# Patient Record
Sex: Male | Born: 1940 | Race: White | Hispanic: No | Smoking: Former smoker
Health system: Southern US, Community
[De-identification: ages and names within clinical notes are randomized; demographics above are authoritative.]

## PROBLEM LIST (undated history)

## (undated) DIAGNOSIS — I10 Essential (primary) hypertension: Secondary | ICD-10-CM

## (undated) DIAGNOSIS — C4490 Unspecified malignant neoplasm of skin, unspecified: Secondary | ICD-10-CM

## (undated) DIAGNOSIS — F329 Major depressive disorder, single episode, unspecified: Secondary | ICD-10-CM

## (undated) DIAGNOSIS — Z86010 Personal history of colonic polyps: Secondary | ICD-10-CM

## (undated) DIAGNOSIS — B159 Hepatitis A without hepatic coma: Secondary | ICD-10-CM

## (undated) DIAGNOSIS — K635 Polyp of colon: Secondary | ICD-10-CM

## (undated) DIAGNOSIS — N400 Enlarged prostate without lower urinary tract symptoms: Secondary | ICD-10-CM

## (undated) DIAGNOSIS — E782 Mixed hyperlipidemia: Secondary | ICD-10-CM

## (undated) DIAGNOSIS — K5790 Diverticulosis of intestine, part unspecified, without perforation or abscess without bleeding: Secondary | ICD-10-CM

## (undated) DIAGNOSIS — I2510 Atherosclerotic heart disease of native coronary artery without angina pectoris: Secondary | ICD-10-CM

## (undated) DIAGNOSIS — N200 Calculus of kidney: Secondary | ICD-10-CM

## (undated) HISTORY — PX: APPENDECTOMY: SHX54

## (undated) HISTORY — DX: Major depressive disorder, single episode, unspecified: F32.9

## (undated) HISTORY — PX: TONSILLECTOMY: SUR1361

## (undated) HISTORY — DX: Calculus of kidney: N20.0

## (undated) HISTORY — DX: Polyp of colon: K63.5

## (undated) HISTORY — DX: Benign prostatic hyperplasia without lower urinary tract symptoms: N40.0

## (undated) HISTORY — DX: Unspecified malignant neoplasm of skin, unspecified: C44.90

## (undated) HISTORY — DX: Personal history of colonic polyps: Z86.010

## (undated) HISTORY — DX: Essential (primary) hypertension: I10

## (undated) HISTORY — DX: Mixed hyperlipidemia: E78.2

## (undated) HISTORY — DX: Atherosclerotic heart disease of native coronary artery without angina pectoris: I25.10

## (undated) HISTORY — DX: Diverticulosis of intestine, part unspecified, without perforation or abscess without bleeding: K57.90

## (undated) HISTORY — DX: Hepatitis a without hepatic coma: B15.9

## (undated) HISTORY — PX: CYSTOSCOPY: SUR368

---

## 1961-04-17 HISTORY — PX: OTHER SURGICAL HISTORY: SHX169

## 1973-04-17 HISTORY — PX: OTHER SURGICAL HISTORY: SHX169

## 1990-04-17 DIAGNOSIS — B159 Hepatitis A without hepatic coma: Secondary | ICD-10-CM

## 1990-04-17 HISTORY — DX: Hepatitis a without hepatic coma: B15.9

## 1995-04-18 DIAGNOSIS — F32A Depression, unspecified: Secondary | ICD-10-CM

## 1995-04-18 HISTORY — DX: Depression, unspecified: F32.A

## 2002-04-15 ENCOUNTER — Ambulatory Visit (HOSPITAL_COMMUNITY): Admission: RE | Admit: 2002-04-15 | Discharge: 2002-04-15 | Payer: Self-pay | Admitting: Urology

## 2002-04-15 ENCOUNTER — Encounter: Payer: Self-pay | Admitting: Urology

## 2002-04-17 HISTORY — PX: CORONARY ARTERY BYPASS GRAFT: SHX141

## 2002-04-30 ENCOUNTER — Encounter: Payer: Self-pay | Admitting: Cardiology

## 2002-05-05 ENCOUNTER — Encounter: Payer: Self-pay | Admitting: Cardiology

## 2002-05-16 ENCOUNTER — Encounter: Payer: Self-pay | Admitting: Cardiology

## 2002-05-16 ENCOUNTER — Inpatient Hospital Stay (HOSPITAL_COMMUNITY): Admission: EM | Admit: 2002-05-16 | Discharge: 2002-05-23 | Payer: Self-pay | Admitting: Cardiology

## 2002-05-17 ENCOUNTER — Encounter: Payer: Self-pay | Admitting: Cardiology

## 2002-05-19 ENCOUNTER — Encounter: Payer: Self-pay | Admitting: Cardiology

## 2002-05-19 ENCOUNTER — Encounter: Payer: Self-pay | Admitting: Cardiothoracic Surgery

## 2002-05-20 ENCOUNTER — Encounter: Payer: Self-pay | Admitting: Cardiothoracic Surgery

## 2002-05-21 ENCOUNTER — Encounter: Payer: Self-pay | Admitting: Cardiology

## 2002-05-23 ENCOUNTER — Encounter: Payer: Self-pay | Admitting: Cardiology

## 2007-12-26 ENCOUNTER — Encounter: Payer: Self-pay | Admitting: Cardiology

## 2008-01-13 ENCOUNTER — Encounter: Payer: Self-pay | Admitting: Cardiology

## 2008-04-17 DIAGNOSIS — K635 Polyp of colon: Secondary | ICD-10-CM

## 2008-04-17 HISTORY — DX: Polyp of colon: K63.5

## 2008-10-14 ENCOUNTER — Encounter: Payer: Self-pay | Admitting: Cardiology

## 2009-03-17 HISTORY — PX: COLONOSCOPY: SHX174

## 2009-03-23 ENCOUNTER — Encounter: Payer: Self-pay | Admitting: Cardiology

## 2009-03-23 DIAGNOSIS — Z860100 Personal history of colon polyps, unspecified: Secondary | ICD-10-CM

## 2009-03-23 HISTORY — DX: Personal history of colon polyps, unspecified: Z86.0100

## 2009-03-23 HISTORY — DX: Personal history of colonic polyps: Z86.010

## 2009-10-14 ENCOUNTER — Encounter: Payer: Self-pay | Admitting: Cardiology

## 2009-11-15 ENCOUNTER — Encounter: Payer: Self-pay | Admitting: Cardiology

## 2010-06-08 ENCOUNTER — Telehealth (INDEPENDENT_AMBULATORY_CARE_PROVIDER_SITE_OTHER): Payer: Self-pay | Admitting: *Deleted

## 2010-06-09 ENCOUNTER — Encounter (INDEPENDENT_AMBULATORY_CARE_PROVIDER_SITE_OTHER): Payer: Self-pay | Admitting: *Deleted

## 2010-06-14 NOTE — Consult Note (Signed)
Summary: Consultation Report/ DR. OWEN  Consultation Report/ DR. OWEN   Imported By: Dorise Hiss 06/06/2010 15:21:12  _____________________________________________________________________  External Attachment:    Type:   Image     Comment:   External Document

## 2010-06-14 NOTE — Cardiovascular Report (Signed)
Summary: Cardiac Catheterization  Cardiac Catheterization   Imported By: Dorise Hiss 06/06/2010 15:15:30  _____________________________________________________________________  External Attachment:    Type:   Image     Comment:   External Document

## 2010-06-14 NOTE — Progress Notes (Signed)
Summary: Office Visit/ [facility]  Office Visit/ [facility]   Imported By: Dorise Hiss 06/06/2010 15:24:36  _____________________________________________________________________  External Attachment:    Type:   Image     Comment:   External Document

## 2010-06-14 NOTE — Op Note (Signed)
Summary: Operative Report/ DR. [email or web address]  Operative Report/ DR. Tyrone Sage   Imported By: Dorise Hiss 06/06/2010 15:19:11  _____________________________________________________________________  External Attachment:    Type:   Image     Comment:   External Document

## 2010-06-14 NOTE — Letter (Signed)
Summary: Discharge Summary/ DR. [email or web address]  Discharge Summary/ DR. Tyrone Sage   Imported By: Dorise Hiss 06/06/2010 15:22:57  _____________________________________________________________________  External Attachment:    Type:   Image     Comment:   External Document

## 2010-06-14 NOTE — Letter (Signed)
Summary: Pharmacist, community at [facility]. [address]   [place], [place] [address]   Phone: [phone]  Fax: [phone]      [facility] Cardiovascular Services  Cardiolite Stress Test     Warren Rojas  Your doctor has ordered a Cardiolite Stress Test to help determine the condition of your heart during stress. If you take blood pressure medicine, ask your doctor if you should take it the day of your test. You should not have anything to eat or drink at least 4 hours before your test is scheduled, and no caffeine (coffee, tea, decaf. or chocolate) for 24 hours before your test.   You will need to register at the Outpatient/Main Entrance at the hospital 30 minutes before your appointment time. It is a good idea to bring a copy of your order with you. They will direct you to the Diagnostic Imaging (Radiology) Department.  You will be asked to undress from the waist up and be given a hospital gown to wear, so dress comfortably from the waist down, for example:    Sweat pants, shorts or skirt   Rubber-soled lace up shoes (i.e. tennis shoes)  Plan on about three hours from registration to release from the hospital.    Date of Test:              Time of Test              Hold Atenolol 24 hours before test.

## 2010-06-14 NOTE — Progress Notes (Signed)
Summary: Pt Requested Call  Phone Note Call from Patient Call back at Home Phone [phone]   Summary of Call: Pt left message on voicemail asking for return call to discuss some problems as 3/2 appt has been moved to 3/30 d/t MD schedule change.   Left message to call back on voicemail. Initial call taken by: Cyril Loosen, RN, BSN,  June 08, 2010 4:10 PM     Appended Document: Pt Requested Call Pt states he's very active. He states sometimes when he's working on something he has an "ache" in his chest. Pt states he has no pain. He states sometimes he has a little tightness and SOB. He just wants to see Dr. Diona Browner to evaluate b/c he hasn't been seen in a while. Pt was scheduled for 3/30. He states he feels this appt date is fine. He will call back to the office if symptoms worsen. Pt has h/o CABG and states he needs 100,000 mile work up.   Appended Document: Pt Requested Call Does not appear that I have seen him since 2004. Since he is having some symptoms, would suggest that we go ahead and schedule an exercise Cardiolite, so I can review this with him at his office visit in March. We should try to see him sooner if his symptoms worsen in the interim however.  Appended Document: Pt Requested Call Left message to call back on voicemail.  Appended Document: Pt Requested Call Pt notified and verbalized understanding. Pt agreeable to test. Pt given verbal instructions for test over phone.   Clinical Lists Changes  Problems: Added new problem of CHEST DISCOMFORT (NFA-213.08) Medications: Added new medication of ATENOLOL 50 MG TABS (ATENOLOL) Take one tablet by mouth daily Added new medication of SIMVASTATIN 20 MG TABS (SIMVASTATIN) Take one tablet by mouth daily at bedtime Added new medication of FLOMAX 0.4 MG CAPS (TAMSULOSIN HCL) Take 1 capsule by mouth once a day Added new medication of FISH OIL 1200 MG CAPS (OMEGA-3 FATTY ACIDS) Take 1 capsule by mouth two times a  day Added new medication of COQ10 100 MG CAPS (COENZYME Q10) Take 1 capsule by mouth once a day Added new medication of MULTIVITAMINS  TABS (MULTIPLE VITAMIN) Take 1 tablet by mouth once a day Added new medication of COSAMIN DS 500-400 MG TABS (GLUCOSAMINE-CHONDROITIN) Take 3 tablets by mouth daily. Added new medication of ASPIRIN EC 325 MG TBEC (ASPIRIN) Take one tablet by mouth daily Orders: Added new Referral order of Nuclear Med (Nuc Med) - Signed

## 2010-06-14 NOTE — Progress Notes (Signed)
Summary: Office Visit/ [facility]  Office Visit/ [facility]   Imported By: Dorise Hiss 06/06/2010 15:25:46  _____________________________________________________________________  External Attachment:    Type:   Image     Comment:   External Document

## 2010-06-17 ENCOUNTER — Ambulatory Visit: Payer: Self-pay | Admitting: Cardiology

## 2010-07-05 DIAGNOSIS — R072 Precordial pain: Secondary | ICD-10-CM

## 2010-07-11 ENCOUNTER — Telehealth: Payer: Self-pay | Admitting: *Deleted

## 2010-07-11 ENCOUNTER — Encounter: Payer: Self-pay | Admitting: Cardiology

## 2010-07-11 NOTE — Telephone Encounter (Signed)
Left message to call back on voicemail regarding results.  

## 2010-07-12 ENCOUNTER — Encounter: Payer: Self-pay | Admitting: *Deleted

## 2010-07-12 ENCOUNTER — Telehealth: Payer: Self-pay | Admitting: *Deleted

## 2010-07-12 NOTE — Telephone Encounter (Signed)
Pt's son, Gerlene Burdock, notified of results.

## 2010-07-12 NOTE — Telephone Encounter (Signed)
error 

## 2010-07-14 ENCOUNTER — Encounter: Payer: Self-pay | Admitting: Cardiology

## 2010-07-14 DIAGNOSIS — I2510 Atherosclerotic heart disease of native coronary artery without angina pectoris: Secondary | ICD-10-CM | POA: Insufficient documentation

## 2010-07-14 DIAGNOSIS — E782 Mixed hyperlipidemia: Secondary | ICD-10-CM | POA: Insufficient documentation

## 2010-07-14 DIAGNOSIS — I10 Essential (primary) hypertension: Secondary | ICD-10-CM | POA: Insufficient documentation

## 2010-07-15 ENCOUNTER — Ambulatory Visit (INDEPENDENT_AMBULATORY_CARE_PROVIDER_SITE_OTHER): Payer: Medicare Other | Admitting: Cardiology

## 2010-07-15 ENCOUNTER — Encounter: Payer: Self-pay | Admitting: Cardiology

## 2010-07-15 VITALS — BP 130/57 | HR 60 | Ht 73.0 in | Wt 212.0 lb

## 2010-07-15 DIAGNOSIS — I10 Essential (primary) hypertension: Secondary | ICD-10-CM

## 2010-07-15 DIAGNOSIS — E782 Mixed hyperlipidemia: Secondary | ICD-10-CM

## 2010-07-15 DIAGNOSIS — I2510 Atherosclerotic heart disease of native coronary artery without angina pectoris: Secondary | ICD-10-CM

## 2010-07-15 DIAGNOSIS — I779 Disorder of arteries and arterioles, unspecified: Secondary | ICD-10-CM

## 2010-07-15 NOTE — Assessment & Plan Note (Signed)
Mild carotid atherosclerosis by carotid Dopplers at [facility] as noted above. Continue aspirin and efforts at lipid control.

## 2010-07-15 NOTE — Assessment & Plan Note (Signed)
Appears to have been well controlled over time, followed by Dr. Sherril Croon. Recent labs reviewed.

## 2010-07-15 NOTE — Assessment & Plan Note (Signed)
Blood pressure reasonable today. Continue present regimen.

## 2010-07-15 NOTE — Patient Instructions (Signed)
Your physician you to follow up in 1 year. You will receive a reminder letter in the mail one-two months in advance. If you don't receive a letter, please call our office to schedule the follow-up appointment. Your physician recommends that you continue on your current medications as directed. Please refer to the Current Medication list given to you today. 

## 2010-07-15 NOTE — Assessment & Plan Note (Signed)
Symptomatically stable at this point, known multivessel disease status post CABG in 2004. Recent Cardiolite reviewed, some evidence of anteroapical scar with low normal LVEF, however no active ischemia. We discussed warning signs, and continuing medical therapy. Annual visit will be scheduled, sooner if needed.

## 2010-07-15 NOTE — Progress Notes (Signed)
Clinical Summary Mr. Geiman is a 70 y.o.male presenting to the office for followup, having not been seen since 2004. He states that he has been following with Dr. Sherril Croon regularly. In terms of symptoms, he denies any clear-cut angina. He does report some left shoulder pain radiating into his chest after doing some vigorous activity in the yard. This has improved. We did refer him for a Cardiolite study after phone call to the office.  Recent followup exercise Cardiolite from 20 March showed no diagnostic ST segment changes, LVEF 49%, medium fixed defect in the mid to apical anterior distribution associated with hypokinesis as well as inferior defect associated with normal wall motion. Potentially indicative of anterior apical scar but no frank ischemia. We discussed this today.  Lab work from June 2011 showed BUN 18, creatinine 0.9, AST 24, ALT 20, cholesterol 145, triglycerides 105, HDL 38, LDL 86, sodium 138, potassium 5.0, TSH 3.7, hemoglobin 15.7, platelets 234.  Records indicate an exercise Cardiolite done through [facility] back in September 2009 demonstrating abnormal ST segment changes, however normal perfusion, LVEF 65%. Carotid Dopplers from that time showed 0-39% bilateral ICA stenoses.   Allergies  Allergen Reactions  . Codeine   . Innovar (Fentanyl-Droperidol)     Current outpatient prescriptions:aspirin 325 MG tablet, Take 325 mg by mouth daily.  , Disp: , Rfl: ;  atenolol (TENORMIN) 50 MG tablet, Take 50 mg by mouth daily.  , Disp: , Rfl: ;  Coenzyme Q10 (CO Q 10) 10 MG CAPS, Take one by mouth daily  , Disp: , Rfl: ;  Glucosamine-Chondroitin (COSAMIN DS) 500-400 MG CAPS, Take 3 by mouth daily  , Disp: , Rfl: ;  Multiple Vitamin (MULTIVITAMIN) tablet, Take 1 tablet by mouth daily.  , Disp: , Rfl:  Omega-3 Fatty Acids (FISH OIL) 1200 MG CAPS, Take one by mouth twice daily  , Disp: , Rfl: ;  simvastatin (ZOCOR) 20 MG tablet, Take 20 mg by mouth at bedtime. , Disp: , Rfl: ;   Tamsulosin HCl (FLOMAX) 0.4 MG CAPS, Take one by mouth daily  , Disp: , Rfl:    Past Medical History  Diagnosis Date  . Essential hypertension, benign   . Mixed hyperlipidemia   . Coronary atherosclerosis of native coronary artery     Multivessel, normal LVEF  . Bladder outlet obstruction     Past Surgical History  Procedure Date  . Resection of urethral diverticulum 1975  . Deviated nasal septum repair [id number]  . Tonsillectomy   . Appendectomy   . Cystoscopy   . Coronary artery bypass graft 2004    Dr. Tyrone Sage - LIMA to LAD, RIMA to ramus, left radial to OM, SVG to diagonal, SVG to PDA    Family History  Problem Relation Age of Onset  . Coronary artery disease Brother     Premature cardiovascular disease    Social History Mr. Voorhees reports that he has quit smoking. His smoking use included Cigarettes. He has never used smokeless tobacco. Mr. Wisehart reports that he drinks alcohol.  Review of Systems No fevers, chills, or unusual weight change. No progressive shortness of breath, cough, hemoptysis, or wheezing. No palpitations, dizziness, or syncope. No dysphasia or odynophagia. Stable appetite with no abdominal pain, melena, or hematochezia. No orthopnea, PND, or lower extremity edema. No focal motor weakness, memory problems, or speech deficits. Otherwise systems reviewed and negative except as already outlined.   Physical Examination Filed Vitals:   07/15/10 0954  BP: 130/57  Pulse:  60  Overweight male in no acute distress. HEENT: Conjunctiva and lids normal, oropharynx with moist mucosa. Neck: Supple, no elevated JVP or carotid bruits, no thyromegaly. Lungs: Clear to auscultation, nonlabored. Thorax: Stable, well-healed sternal incision. Cardiac: Regular rate and rhythm, no S3 gallop or rub. Abdomen: Soft, nontender, bowel sounds present. Skin: Warm and dry. Extremities: No pitting edema, distal pulses 1-2+. Musculoskeletal: No gross  deformities. Neuropsychiatric: Alert and oriented x3, affect appropriate.   ECG Sinus bradycardia at 58 beats per minute with nonspecific anterolateral T wave changes.   Problem List and Plan

## 2011-07-06 ENCOUNTER — Encounter: Payer: Self-pay | Admitting: Cardiology

## 2011-07-06 ENCOUNTER — Ambulatory Visit (INDEPENDENT_AMBULATORY_CARE_PROVIDER_SITE_OTHER): Payer: Medicare Other | Admitting: Cardiology

## 2011-07-06 VITALS — BP 151/70 | HR 58 | Ht 73.0 in | Wt 214.0 lb

## 2011-07-06 DIAGNOSIS — I2510 Atherosclerotic heart disease of native coronary artery without angina pectoris: Secondary | ICD-10-CM

## 2011-07-06 DIAGNOSIS — E782 Mixed hyperlipidemia: Secondary | ICD-10-CM

## 2011-07-06 DIAGNOSIS — I779 Disorder of arteries and arterioles, unspecified: Secondary | ICD-10-CM

## 2011-07-06 DIAGNOSIS — I10 Essential (primary) hypertension: Secondary | ICD-10-CM

## 2011-07-06 NOTE — Progress Notes (Signed)
   Clinical Summary Mr. Greenleaf is a 71 y.o.male presenting for followup. He was seen in March of last year. He reports that he feels "great." No active angina or shortness of breath with exertion. He states he is walking 2 miles most days of the week depending on the weather. Also reports compliance with his medications, no significant bleeding or reflux symptoms.  He continues to follow Dr. Sherril Croon, has a full physical scheduled for July with lab work.  I reviewed his testing from last year. Followup ECG is reviewed below.   Allergies  Allergen Reactions  . Codeine   . Innovar (Fentanyl-Droperidol)     Current Outpatient Prescriptions  Medication Sig Dispense Refill  . aspirin 325 MG tablet Take 325 mg by mouth daily.        Marland Kitchen atenolol (TENORMIN) 50 MG tablet Take 50 mg by mouth daily.        . Coenzyme Q10 (COQ-10) 100 MG CAPS Take 1 capsule by mouth daily.      . Glucosamine-Chondroitin (COSAMIN DS) 500-400 MG CAPS Take 3 by mouth daily        . Multiple Vitamin (MULTIVITAMIN) tablet Take 1 tablet by mouth daily.        . Omega-3 Fatty Acids (FISH OIL) 1200 MG CAPS Take 1 capsule by mouth 2 (two) times daily.       . simvastatin (ZOCOR) 20 MG tablet Take 20 mg by mouth at bedtime.       . Tamsulosin HCl (FLOMAX) 0.4 MG CAPS Take one by mouth daily          Past Medical History  Diagnosis Date  . Essential hypertension, benign   . Mixed hyperlipidemia   . Coronary atherosclerosis of native coronary artery     Multivessel, normal LVEF  . Bladder outlet obstruction     Past Surgical History  Procedure Date  . Resection of urethral diverticulum 1975  . Deviated nasal septum repair [id number]  . Tonsillectomy   . Appendectomy   . Cystoscopy   . Coronary artery bypass graft 2004    Dr. Tyrone Sage - LIMA to LAD, RIMA to ramus, left radial to OM, SVG to diagonal, SVG to PDA    Family History  Problem Relation Age of Onset  . Coronary artery disease Brother     Premature  cardiovascular disease    Social History Mr. Reggio reports that he has quit smoking. His smoking use included Cigarettes. He has never used smokeless tobacco. Mr. Montano reports that he drinks alcohol.  Review of Systems No palpitations. No bleeding problems. No peripheral edema. No dizziness or syncope. Stable appetite. Otherwise negative.  Physical Examination Filed Vitals:   07/06/11 1056  BP: 151/70  Pulse: 58   Normally nourished appearing male in no acute distress.  HEENT: Conjunctiva and lids normal, oropharynx with moist mucosa.  Neck: Supple, no elevated JVP or carotid bruits, no thyromegaly.  Lungs: Clear to auscultation, nonlabored.  Thorax: Stable, well-healed sternal incision.  Cardiac: Regular rate and rhythm, no S3 gallop or rub.  Abdomen: Soft, nontender, bowel sounds present.  Skin: Warm and dry.  Extremities: No pitting edema, distal pulses 1-2+.  Musculoskeletal: No gross deformities.  Neuropsychiatric: Alert and oriented x3, affect appropriate.   ECG Sinus bradycardia, normal.   Problem List and Plan

## 2011-07-06 NOTE — Assessment & Plan Note (Signed)
Mild carotid atherosclerosis by carotid Dopplers at [facility]. Continue aspirin and efforts at lipid control.

## 2011-07-06 NOTE — Assessment & Plan Note (Signed)
He continues on simvastatin. Lipids have been followed by Dr. Sherril Croon. Goal LDL under 100.

## 2011-07-06 NOTE — Patient Instructions (Signed)
Your physician you to follow up in 1 year. You will receive a reminder letter in the mail one-two months in advance. If you don't receive a letter, please call our office to schedule the follow-up appointment. Your physician recommends that you continue on your current medications as directed. Please refer to the Current Medication list given to you today. 

## 2011-07-06 NOTE — Assessment & Plan Note (Signed)
Blood pressure is up some today. Continue diet and exercise, follow up with Dr. Sherril Croon. No changes made to present medical regimen.

## 2011-07-06 NOTE — Assessment & Plan Note (Signed)
Symptomatically stable on medical therapy. Followup ECG is normal. He reports compliance with his medications, also regular exercise. Continue observation and annual followup.

## 2013-10-06 ENCOUNTER — Emergency Department (HOSPITAL_COMMUNITY): Payer: Medicare Other

## 2013-10-06 ENCOUNTER — Encounter (HOSPITAL_COMMUNITY): Payer: Self-pay | Admitting: Emergency Medicine

## 2013-10-06 DIAGNOSIS — I2510 Atherosclerotic heart disease of native coronary artery without angina pectoris: Secondary | ICD-10-CM | POA: Insufficient documentation

## 2013-10-06 DIAGNOSIS — N139 Obstructive and reflux uropathy, unspecified: Secondary | ICD-10-CM | POA: Insufficient documentation

## 2013-10-06 DIAGNOSIS — Z87891 Personal history of nicotine dependence: Secondary | ICD-10-CM | POA: Insufficient documentation

## 2013-10-06 DIAGNOSIS — Z885 Allergy status to narcotic agent status: Secondary | ICD-10-CM | POA: Insufficient documentation

## 2013-10-06 DIAGNOSIS — E782 Mixed hyperlipidemia: Secondary | ICD-10-CM | POA: Insufficient documentation

## 2013-10-06 DIAGNOSIS — R1013 Epigastric pain: Secondary | ICD-10-CM | POA: Insufficient documentation

## 2013-10-06 DIAGNOSIS — Z79899 Other long term (current) drug therapy: Secondary | ICD-10-CM | POA: Insufficient documentation

## 2013-10-06 DIAGNOSIS — I10 Essential (primary) hypertension: Secondary | ICD-10-CM | POA: Insufficient documentation

## 2013-10-06 DIAGNOSIS — Z888 Allergy status to other drugs, medicaments and biological substances status: Secondary | ICD-10-CM | POA: Insufficient documentation

## 2013-10-06 DIAGNOSIS — R112 Nausea with vomiting, unspecified: Secondary | ICD-10-CM | POA: Insufficient documentation

## 2013-10-06 DIAGNOSIS — Z881 Allergy status to other antibiotic agents status: Secondary | ICD-10-CM | POA: Insufficient documentation

## 2013-10-06 LAB — CBC WITH DIFFERENTIAL/PLATELET
BASOS PCT: 0 % (ref 0–1)
Basophils Absolute: 0 10*3/uL (ref 0.0–0.1)
EOS ABS: 0.1 10*3/uL (ref 0.0–0.7)
EOS PCT: 1 % (ref 0–5)
HEMATOCRIT: 43.7 % (ref 39.0–52.0)
Hemoglobin: 15.4 g/dL (ref 13.0–17.0)
Lymphocytes Relative: 12 % (ref 12–46)
Lymphs Abs: 2 10*3/uL (ref 0.7–4.0)
MCH: 32.8 pg (ref 26.0–34.0)
MCHC: 35.2 g/dL (ref 30.0–36.0)
MCV: 93.2 fL (ref 78.0–100.0)
MONO ABS: 0.6 10*3/uL (ref 0.1–1.0)
Monocytes Relative: 4 % (ref 3–12)
NEUTROS ABS: 14.8 10*3/uL — AB (ref 1.7–7.7)
Neutrophils Relative %: 83 % — ABNORMAL HIGH (ref 43–77)
Platelets: 229 10*3/uL (ref 150–400)
RBC: 4.69 MIL/uL (ref 4.22–5.81)
RDW: 11.6 % (ref 11.5–15.5)
WBC: 17.6 10*3/uL — ABNORMAL HIGH (ref 4.0–10.5)

## 2013-10-06 LAB — COMPREHENSIVE METABOLIC PANEL
ALBUMIN: 4.3 g/dL (ref 3.5–5.2)
ALT: 117 U/L — ABNORMAL HIGH (ref 0–53)
AST: 209 U/L — ABNORMAL HIGH (ref 0–37)
Alkaline Phosphatase: 110 U/L (ref 39–117)
BILIRUBIN TOTAL: 0.8 mg/dL (ref 0.3–1.2)
BUN: 27 mg/dL — AB (ref 6–23)
CALCIUM: 9.4 mg/dL (ref 8.4–10.5)
CHLORIDE: 96 meq/L (ref 96–112)
CO2: 27 mEq/L (ref 19–32)
Creatinine, Ser: 0.89 mg/dL (ref 0.50–1.35)
GFR calc Af Amer: >90 mL/min (ref >90)
GFR calc non Af Amer: 84 mL/min — ABNORMAL LOW (ref >90)
Glucose, Bld: 199 mg/dL — ABNORMAL HIGH (ref 70–99)
Potassium: 4 mEq/L (ref 3.7–5.3)
Sodium: 139 mEq/L (ref 137–147)
Total Protein: 7.6 g/dL (ref 6.0–8.3)

## 2013-10-06 LAB — TROPONIN I

## 2013-10-06 MED ORDER — ONDANSETRON 4 MG PO TBDP
4.0000 mg | ORAL_TABLET | Freq: Once | ORAL | Status: AC
  Administered 2013-10-06: 4 mg via ORAL
  Filled 2013-10-06: qty 1

## 2013-10-06 NOTE — ED Notes (Signed)
Patient transported to X-ray 

## 2013-10-06 NOTE — ED Notes (Signed)
Pt states pain that is lower chest and radiates to his back. Pt states that his pain was after he ate. Pt states that he has been nauseated and he vomited once in triage. Pt states deep pain that catches his breath.

## 2013-10-07 ENCOUNTER — Emergency Department (HOSPITAL_COMMUNITY): Payer: Medicare Other

## 2013-10-07 ENCOUNTER — Emergency Department (HOSPITAL_COMMUNITY)
Admission: EM | Admit: 2013-10-07 | Discharge: 2013-10-07 | Disposition: A | Discharge status: Home / Self Care | Payer: Medicare Other | Attending: Emergency Medicine | Admitting: Emergency Medicine

## 2013-10-07 DIAGNOSIS — R112 Nausea with vomiting, unspecified: Secondary | ICD-10-CM

## 2013-10-07 DIAGNOSIS — R1013 Epigastric pain: Secondary | ICD-10-CM

## 2013-10-07 LAB — URINALYSIS, ROUTINE W REFLEX MICROSCOPIC
BILIRUBIN URINE: NEGATIVE
Glucose, UA: NEGATIVE mg/dL
Hgb urine dipstick: NEGATIVE
KETONES UR: NEGATIVE mg/dL
Leukocytes, UA: NEGATIVE
NITRITE: NEGATIVE
Protein, ur: NEGATIVE mg/dL
Specific Gravity, Urine: 1.028 (ref 1.005–1.030)
Urobilinogen, UA: 1 mg/dL (ref 0.0–1.0)
pH: 7 (ref 5.0–8.0)

## 2013-10-07 LAB — LIPASE, BLOOD: Lipase: 1287 U/L — ABNORMAL HIGH (ref 11–59)

## 2013-10-07 MED ORDER — IOHEXOL 300 MG/ML  SOLN
80.0000 mL | Freq: Once | INTRAMUSCULAR | Status: AC | PRN
  Administered 2013-10-07: 80 mL via INTRAVENOUS

## 2013-10-07 NOTE — ED Notes (Signed)
CT notified that pt is finished with contrast.  

## 2013-10-07 NOTE — ED Provider Notes (Signed)
CSN: [id number]     Arrival date & time 10/06/13  2135 History   First MD Initiated Contact with Patient 10/07/13 0259     Chief Complaint  Patient presents with  . Chest Pain  . Emesis     Patient is a 73 y.o. male presenting with abdominal pain. The history is provided by the patient.  Abdominal Pain Pain location:  Epigastric Pain quality: sharp   Pain radiation: back. Pain severity:  Moderate Onset quality:  Sudden Progression:  Resolved Chronicity:  New Context: eating   Relieved by:  Nothing Worsened by:  Nothing tried Associated symptoms: nausea and vomiting   Associated symptoms: no chest pain and no fever   PT reports that soon after eating, he had onset of epigastric pain that radiated to his back.  He reports it lasted up to 30 minutes He denies CP He reports he had nausea/vomiting  He is now pain free.  He has never had this pain before.   Past Medical History  Diagnosis Date  . Essential hypertension, benign   . Mixed hyperlipidemia   . Coronary atherosclerosis of native coronary artery     Multivessel, normal LVEF  . Bladder outlet obstruction    Past Surgical History  Procedure Laterality Date  . Resection of urethral diverticulum  1975  . Deviated nasal septum repair  [id number]  . Tonsillectomy    . Appendectomy    . Cystoscopy    . Coronary artery bypass graft  2004    Dr. Servando Snare - LIMA to LAD, RIMA to ramus, left radial to OM, SVG to diagonal, SVG to PDA   Family History  Problem Relation Age of Onset  . Coronary artery disease Brother     Premature cardiovascular disease   History  Substance Use Topics  . Smoking status: Former Smoker    Types: Cigarettes  . Smokeless tobacco: Never Used     Comment: smoked 3 packs total while in [employer]  . Alcohol Use: Yes     Comment: Occasional    Review of Systems  Constitutional: Negative for fever.  Cardiovascular: Negative for chest pain.  Gastrointestinal: Positive for nausea, vomiting and  abdominal pain.  All other systems reviewed and are negative.     Allergies  Innovar; Cephalosporins; and Codeine  Home Medications   Prior to Admission medications   Medication Sig Start Date End Date Taking? Authorizing Provider  atenolol (TENORMIN) 50 MG tablet Take 50 mg by mouth daily.     Yes Historical Provider, MD  AVODART 0.5 MG capsule Take 0.5 mg by mouth daily.  09/14/13  Yes Historical Provider, MD  Coenzyme Q10 (COQ-10) 100 MG CAPS Take 1 capsule by mouth daily.   Yes Historical Provider, MD  Multiple Vitamin (MULTIVITAMIN) tablet Take 1 tablet by mouth daily.     Yes Historical Provider, MD  simvastatin (ZOCOR) 20 MG tablet Take 20 mg by mouth at bedtime.    Yes Historical Provider, MD  Tamsulosin HCl (FLOMAX) 0.4 MG CAPS Take 0.4 mg by mouth 2 (two) times daily. Take one by mouth daily   Yes Historical Provider, MD   BP 144/68  Pulse 81  Temp(Src) 98 F (36.7 C) (Oral)  Resp 16  Ht 6\' 1"  (1.854 m)  Wt 208 lb (94.348 kg)  BMI 27.45 kg/m2  SpO2 98% Physical Exam CONSTITUTIONAL: Well developed/well nourished HEAD: Normocephalic/atraumatic EYES: EOMI/PERRL, no icterus ENMT: Mucous membranes moist NECK: supple no meningeal signs SPINE:entire spine nontender CV: S1/S2 noted,  no murmurs/rubs/gallops noted LUNGS: Lungs are clear to auscultation bilaterally, no apparent distress ABDOMEN: soft, mild epigastric tenderness, no rebound or guarding GU:no cva tenderness NEURO: Pt is awake/alert, moves all extremitiesx4 EXTREMITIES: pulses normal, full ROM SKIN: warm, color normal PSYCH: no abnormalities of mood noted  ED Course  Procedures   Pt monitored in the ED for several hours Extensive workup initiated due to h/o significant abd pain prior to arrival with abnormal CBC/LFTs/lipase.  Concern for pancreatitis, however CT imaging negative for any acute process.  He again denied any CP and reports pain was in abdomen after eating.  He is clinically well appearing.  I  have very low suspicion for occult ACS given his history/exam.  He would like to go home and f/u as outpatient.  Labs Review Labs Reviewed  CBC WITH DIFFERENTIAL - Abnormal; Notable for the following:    WBC 17.6 (*)    Neutrophils Relative % 83 (*)    Neutro Abs 14.8 (*)    All other components within normal limits  COMPREHENSIVE METABOLIC PANEL - Abnormal; Notable for the following:    Glucose, Bld 199 (*)    BUN 27 (*)    AST 209 (*)    ALT 117 (*)    GFR calc non Af Amer 84 (*)    All other components within normal limits  LIPASE, BLOOD - Abnormal; Notable for the following:    Lipase 1287 (*)    All other components within normal limits  TROPONIN I  URINALYSIS, ROUTINE W REFLEX MICROSCOPIC    Imaging Review Dg Chest 2 View  10/06/2013   CLINICAL DATA:  Epigastric pain.  Nausea and vomiting.  EXAM: CHEST  2 VIEW  COMPARISON:  10/24/2010  FINDINGS: Prior CABG. Heart size within normal limits. The lungs appear clear. No pleural effusion.  IMPRESSION: 1. No acute findings.  Prior CABG.   Electronically Signed   By: Sherryl Barters M.D.   On: 10/06/2013 22:05   US Abdomen Complete  10/07/2013   CLINICAL DATA:  Abdominal pain.  EXAM: ULTRASOUND ABDOMEN COMPLETE  COMPARISON:  None.  FINDINGS: Gallbladder:  No gallstones or wall thickening visualized. No sonographic Murphy sign noted.  Common bile duct:  Diameter: 4.2 mm, normal  Liver:  Visualize liver parenchymal echotexture is homogeneous without focal lesions identified. Portions are obscured by rib shadows.  IVC:  No abnormality visualized.  Pancreas:  Not well visualized.  Spleen:  Size and appearance within normal limits.  Right Kidney:  Length: 12.6 cm. 5 mm echogenic focus in the midpole likely representing a small cyst. No evidence of hydronephrosis.  Left Kidney:  Length: 11.7 cm. Echogenicity within normal limits. No mass or hydronephrosis visualized.  Abdominal aorta:  No aneurysm visualized.  Other findings:  None.   IMPRESSION: Gallbladder is unremarkable. Probable nonobstructing stone in the right kidney.   Electronically Signed   By: Lucienne Capers M.D.   On: 10/07/2013 04:44   Ct Abdomen Pelvis W Contrast  10/07/2013   CLINICAL DATA:  Severe epigastric pain, nausea and vomiting.  EXAM: CT ABDOMEN AND PELVIS WITH CONTRAST  TECHNIQUE: Multidetector CT imaging of the abdomen and pelvis was performed using the standard protocol following bolus administration of intravenous contrast.  CONTRAST:  70mL OMNIPAQUE IOHEXOL 300 MG/ML  SOLN  COMPARISON:  None.  FINDINGS: Atelectasis in the lung bases. Postoperative change in the mediastinum. Coronary artery calcification.  The liver, spleen, gallbladder caught pancreas, adrenal glands, kidneys, abdominal aorta, inferior vena cava, and  retroperitoneal lymph nodes are unremarkable the stomach, small bowel, and colon are not abnormally distended. No suggestion of bowel wall thickening. Small duodenum diverticulum. No free air or free fluid in the abdomen.  Pelvis: Prostate gland is enlarged, measuring 6.2 x 5.6 cm. Bladder wall is not thickened. No pelvic mass or lymphadenopathy. We appendix is not identified. No evidence of diverticulitis. Scattered diverticula are present in the sigmoid region. No free or loculated pelvic fluid collections. No significant pelvic lymphadenopathy. Degenerative changes in the spine. No destructive bone lesions are appreciated.  IMPRESSION: No acute process demonstrated in the abdomen or pelvis that would explain patient's symptoms. Enlarged prostate gland.   Electronically Signed   By: Lucienne Capers M.D.   On: 10/07/2013 06:36     EKG Interpretation   Date/Time:  Monday October 06 2013 21:36:37 EDT Ventricular Rate:  52 PR Interval:  168 QRS Duration: 98 QT Interval:  486 QTC Calculation: 451 R Axis:   82 Text Interpretation:  Sinus bradycardia with Premature supraventricular  complexes Otherwise normal ECG artifact noted Confirmed by  Christy Gentles  MD,  Elenore Rota ([id number]) on 10/07/2013 3:00:15 AM      MDM   Final diagnoses:  Epigastric pain  Non-intractable vomiting with nausea, vomiting of unspecified type    Nursing notes including past medical history and social history reviewed and considered in documentation Labs/vital reviewed and considered     Sharyon Cable, MD 10/07/13 0730

## 2013-10-07 NOTE — ED Notes (Signed)
Patient transported to Ultrasound 

## 2013-10-07 NOTE — ED Notes (Signed)
Patient transported to CT 

## 2013-10-07 NOTE — Discharge Instructions (Signed)

## 2013-10-16 ENCOUNTER — Encounter: Payer: Self-pay | Admitting: Internal Medicine

## 2013-11-26 ENCOUNTER — Encounter: Payer: Self-pay | Admitting: Gastroenterology

## 2013-12-15 ENCOUNTER — Encounter: Payer: Self-pay | Admitting: Internal Medicine

## 2013-12-15 ENCOUNTER — Ambulatory Visit (INDEPENDENT_AMBULATORY_CARE_PROVIDER_SITE_OTHER): Payer: Medicare Other | Admitting: Internal Medicine

## 2013-12-15 VITALS — BP 144/80 | HR 56 | Ht 71.0 in | Wt 206.5 lb

## 2013-12-15 DIAGNOSIS — R748 Abnormal levels of other serum enzymes: Secondary | ICD-10-CM | POA: Insufficient documentation

## 2013-12-15 DIAGNOSIS — R7402 Elevation of levels of lactic acid dehydrogenase (LDH): Secondary | ICD-10-CM

## 2013-12-15 DIAGNOSIS — R740 Nonspecific elevation of levels of transaminase and lactic acid dehydrogenase [LDH]: Secondary | ICD-10-CM

## 2013-12-15 DIAGNOSIS — K859 Acute pancreatitis without necrosis or infection, unspecified: Secondary | ICD-10-CM

## 2013-12-15 DIAGNOSIS — Z86010 Personal history of colonic polyps: Secondary | ICD-10-CM

## 2013-12-15 NOTE — Patient Instructions (Signed)
We will contact you after Dr. Owens Loffler reviews your chart about doing an EUS.   Today you have been given a handout on pancreas disease.   I appreciate the opportunity to care for you.

## 2013-12-15 NOTE — Assessment & Plan Note (Signed)
Presumably from a stone in the bile duct. These are resolved. Liver normal on CT and ultrasound.

## 2013-12-15 NOTE — Progress Notes (Signed)
Subjective:    Patient ID: Warren Rojas, male    DOB: February 08, 1941, 73 y.o.   MRN: [id number]  HPI The patient is a very pleasant divorced elderly white man with a history of acute epigastric pain radiating to the back associated with nausea vomiting on June 22. He was evaluated in the common ER with ultrasound and CT scanning which were unrevealing. No gallstones. His transaminases were elevated his lipase was over 2000. He was sick for about 2436 hours was not admitted. He has been well one mild episode similar since. He has seen his internist and his LFTs and lipase are back to normal. He has a son who is a Midwife in [place], and a suggestive maybe he needs a HIDA scan with ejection fraction. He does not have heartburn or dysphagia and is generally well. Dr. Woody Seller think she ought to be considered for an upper endoscopy. GI review of systems is otherwise negative at this time.  Wt Readings from Last 3 Encounters:  12/15/13 206 lb 8 oz (93.668 kg)  10/06/13 208 lb (94.348 kg)  07/06/11 214 lb (97.07 kg)    Allergies  Allergen Reactions  . Innovar [Fentanyl-Droperidol] Anaphylaxis    Aspiration  . Cephalosporins Other (See Comments)    "Had Hepatitis from water and was advised to stay away of these medications"  . Codeine Nausea And Vomiting   Outpatient Prescriptions Prior to Visit  Medication Sig Dispense Refill  . atenolol (TENORMIN) 50 MG tablet Take 50 mg by mouth daily.        . AVODART 0.5 MG capsule Take 0.5 mg by mouth daily.       . Coenzyme Q10 (COQ-10) 100 MG CAPS Take 1 capsule by mouth daily.      . Multiple Vitamin (MULTIVITAMIN) tablet Take 1 tablet by mouth daily.        . simvastatin (ZOCOR) 20 MG tablet Take 20 mg by mouth at bedtime.       . Tamsulosin HCl (FLOMAX) 0.4 MG CAPS Take 0.4 mg by mouth 2 (two) times daily. Take one by mouth daily       No facility-administered medications prior to visit.   Past Medical History  Diagnosis Date  . Essential  hypertension, benign   . Mixed hyperlipidemia   . Coronary atherosclerosis of native coronary artery     Multivessel, normal LVEF  . Bladder outlet obstruction   . Kidney stone   . BPH (benign prostatic hypertrophy)   . Diverticulosis   . Hepatitis A 1/92  . Depression 1997  . Skin cancer   . Colon polyp 2010   Past Surgical History  Procedure Laterality Date  . Resection of urethral diverticulum  1975  . Deviated nasal septum repair  [id number]  . Tonsillectomy    . Appendectomy    . Cystoscopy    . Coronary artery bypass graft  2004    Dr. Servando Snare - LIMA to LAD, RIMA to ramus, left radial to OM, SVG to diagonal, SVG to PDA  . Colonoscopy  03/2009    tortuous colon   History   Social History  . Marital Status: Married    Spouse Name: N/A    Number of Children: 3  . Years of Education: N/A   Occupational History  . Retired     [employer] most recently   Social History Main Topics  . Smoking status: Former Smoker    Types: Cigarettes    Quit date:  04/18/1963  . Smokeless tobacco: Never Used     Comment: smoked 3 packs total while in [employer]  . Alcohol Use: No  . 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 Coronary artery disease Brother     Premature cardiovascular disease  . Heart disease Mother   . Hypertension Mother   . Hypertension Father   . Transient ischemic attack Father   . Anuerysm Father     aortic   . Pancreatic cancer Sister     Review of Systems Positive for back pain after exercise, he urinates excessively he is on Avodart for benign prosthetic hypertrophy. All other systems are negative except in the history of present illness    Objective:   Physical Exam General:  Well-developed, well-nourished and in no acute  distress Eyes:  anicteric. ENT:   Mouth and posterior pharynx free of lesions.  Neck:   supple w/o thyromegaly or mass.  Lungs: Clear to auscultation bilaterally. Heart:  S1S2, no rubs, murmurs, gallops. Abdomen:  soft, non-tender, no hepatosplenomegaly, hernia, or mass and BS+.  Lymph:  no cervical or supraclavicular adenopathy. Extremities:   no edema Skin   no rash. Neuro:  A&O x 3.  Psych:  appropriate mood and  Affect.   Data Reviewed: Primary care notes emergency room visits, images on CT and ultrasound.    Assessment & Plan:  Acute pancreatitis My sense is this was probably biliary in origin. Not sure however. We talked about an empiric cholecystectomy. He is not in favor of that and I understand. HIDA scan could be helpful but I think an endoscopic ultrasound with imaging of the bile duct, gallbladder and pancreas are appropriate given this clinical scenario and his age. I reviewed the risks of that somewhat. He understands the chart will be reviewed by Dr. Ardis Hughs, our endosonographer and a determination will be made and we will contact him.  Abnormal transaminases-transient in the setting of epigastric pain Presumably from a stone in the bile duct. These are resolved. Liver normal on CT and ultrasound.  Personal history of rectal adenoma He should have a repeat colonoscopy in December of this year or thereabouts. We'll place a recall.   I appreciate the opportunity to care for this patient. CC: VYAS,DHRUV B., MD

## 2013-12-15 NOTE — Assessment & Plan Note (Signed)
My sense is this was probably biliary in origin. Not sure however. We talked about an empiric cholecystectomy. He is not in favor of that and I understand. HIDA scan could be helpful but I think an endoscopic ultrasound with imaging of the bile duct, gallbladder and pancreas are appropriate given this clinical scenario and his age. I reviewed the risks of that somewhat. He understands the chart will be reviewed by Dr. Ardis Hughs, our endosonographer and a determination will be made and we will contact him.

## 2013-12-15 NOTE — Assessment & Plan Note (Signed)
He should have a repeat colonoscopy in December of this year or thereabouts. We'll place a recall.

## 2013-12-17 ENCOUNTER — Other Ambulatory Visit: Payer: Self-pay

## 2013-12-17 ENCOUNTER — Telehealth: Payer: Self-pay

## 2013-12-17 ENCOUNTER — Encounter (HOSPITAL_COMMUNITY): Payer: Self-pay | Admitting: Pharmacy Technician

## 2013-12-17 ENCOUNTER — Encounter (HOSPITAL_COMMUNITY): Payer: Self-pay | Admitting: *Deleted

## 2013-12-17 DIAGNOSIS — K8050 Calculus of bile duct without cholangitis or cholecystitis without obstruction: Secondary | ICD-10-CM

## 2013-12-17 DIAGNOSIS — R7989 Other specified abnormal findings of blood chemistry: Secondary | ICD-10-CM

## 2013-12-17 DIAGNOSIS — R109 Unspecified abdominal pain: Secondary | ICD-10-CM

## 2013-12-17 DIAGNOSIS — R945 Abnormal results of liver function studies: Secondary | ICD-10-CM

## 2013-12-17 NOTE — Telephone Encounter (Signed)
Warren Rojas, He needs upper EUS, radial only, +/- ERCP at same time. Next available EUS Thursday. This is for abd pain, elevated liver tests, ? Bile duct stones. Thanks ----- Message ----- From: Gatha Mayer, MD Sent: 12/16/2013 10:12 AM To: Milus Banister, MD Sure OK to set it up Thanks ----- Message ----- From: Milus Banister, MD Sent: 12/15/2013 12:39 PM To: Gatha Mayer, MD Glendell Docker. I think EUS would be helpful. Would you want me to proceed with ERCP at same time if I find any CBD stones? ----- Message ----- From: Gatha Mayer, MD Sent: 12/15/2013 12:11 PM To: Milus Banister, MD Dan, please review his records, I think an endoscopic ultrasound makes sense for him to me know your thoughts.

## 2013-12-17 NOTE — Telephone Encounter (Signed)
EUS scheduled, pt instructed and medications reviewed.  Patient instructions mailed to home.  Patient to call with any questions or concerns.  

## 2013-12-17 NOTE — Telephone Encounter (Signed)
Left message on machine to call back  

## 2013-12-25 ENCOUNTER — Encounter (HOSPITAL_COMMUNITY)
Admission: RE | Disposition: A | Discharge status: Home / Self Care | Payer: Self-pay | Source: Ambulatory Visit | Attending: Gastroenterology

## 2013-12-25 ENCOUNTER — Encounter (HOSPITAL_COMMUNITY): Payer: Self-pay | Admitting: Gastroenterology

## 2013-12-25 ENCOUNTER — Ambulatory Visit (HOSPITAL_COMMUNITY)
Admission: RE | Admit: 2013-12-25 | Discharge: 2013-12-25 | Disposition: A | Discharge status: Home / Self Care | Payer: Medicare Other | Source: Ambulatory Visit | Attending: Gastroenterology | Admitting: Gastroenterology

## 2013-12-25 ENCOUNTER — Encounter (HOSPITAL_COMMUNITY): Payer: Medicare Other | Admitting: Anesthesiology

## 2013-12-25 ENCOUNTER — Ambulatory Visit (HOSPITAL_COMMUNITY): Payer: Medicare Other | Admitting: Anesthesiology

## 2013-12-25 DIAGNOSIS — Z87891 Personal history of nicotine dependence: Secondary | ICD-10-CM | POA: Diagnosis not present

## 2013-12-25 DIAGNOSIS — Z85828 Personal history of other malignant neoplasm of skin: Secondary | ICD-10-CM | POA: Insufficient documentation

## 2013-12-25 DIAGNOSIS — Z951 Presence of aortocoronary bypass graft: Secondary | ICD-10-CM | POA: Insufficient documentation

## 2013-12-25 DIAGNOSIS — F329 Major depressive disorder, single episode, unspecified: Secondary | ICD-10-CM | POA: Diagnosis not present

## 2013-12-25 DIAGNOSIS — I2510 Atherosclerotic heart disease of native coronary artery without angina pectoris: Secondary | ICD-10-CM | POA: Insufficient documentation

## 2013-12-25 DIAGNOSIS — Z85048 Personal history of other malignant neoplasm of rectum, rectosigmoid junction, and anus: Secondary | ICD-10-CM | POA: Diagnosis not present

## 2013-12-25 DIAGNOSIS — I739 Peripheral vascular disease, unspecified: Secondary | ICD-10-CM | POA: Insufficient documentation

## 2013-12-25 DIAGNOSIS — F3289 Other specified depressive episodes: Secondary | ICD-10-CM | POA: Insufficient documentation

## 2013-12-25 DIAGNOSIS — E782 Mixed hyperlipidemia: Secondary | ICD-10-CM | POA: Insufficient documentation

## 2013-12-25 DIAGNOSIS — K8050 Calculus of bile duct without cholangitis or cholecystitis without obstruction: Secondary | ICD-10-CM

## 2013-12-25 DIAGNOSIS — B159 Hepatitis A without hepatic coma: Secondary | ICD-10-CM | POA: Diagnosis not present

## 2013-12-25 DIAGNOSIS — N320 Bladder-neck obstruction: Secondary | ICD-10-CM | POA: Diagnosis not present

## 2013-12-25 DIAGNOSIS — K859 Acute pancreatitis without necrosis or infection, unspecified: Secondary | ICD-10-CM | POA: Diagnosis present

## 2013-12-25 DIAGNOSIS — K5730 Diverticulosis of large intestine without perforation or abscess without bleeding: Secondary | ICD-10-CM | POA: Diagnosis not present

## 2013-12-25 DIAGNOSIS — I10 Essential (primary) hypertension: Secondary | ICD-10-CM | POA: Diagnosis not present

## 2013-12-25 DIAGNOSIS — N138 Other obstructive and reflux uropathy: Secondary | ICD-10-CM | POA: Insufficient documentation

## 2013-12-25 DIAGNOSIS — N401 Enlarged prostate with lower urinary tract symptoms: Secondary | ICD-10-CM | POA: Insufficient documentation

## 2013-12-25 HISTORY — PX: EUS: SHX5427

## 2013-12-25 SURGERY — UPPER ENDOSCOPIC ULTRASOUND (EUS) LINEAR
Anesthesia: General

## 2013-12-25 MED ORDER — PROPOFOL 10 MG/ML IV BOLUS
INTRAVENOUS | Status: AC
  Filled 2013-12-25: qty 20

## 2013-12-25 MED ORDER — SODIUM CHLORIDE 0.9 % IV SOLN: INTRAVENOUS | Status: DC

## 2013-12-25 MED ORDER — LACTATED RINGERS IV SOLN
INTRAVENOUS | Status: DC
  Administered 2013-12-25: 13:00:00 via INTRAVENOUS

## 2013-12-25 MED ORDER — SUCCINYLCHOLINE CHLORIDE 20 MG/ML IJ SOLN
INTRAMUSCULAR | Status: DC | PRN
  Administered 2013-12-25: 100 mg via INTRAVENOUS

## 2013-12-25 MED ORDER — PROPOFOL 10 MG/ML IV BOLUS
INTRAVENOUS | Status: DC | PRN
  Administered 2013-12-25: 180 mg via INTRAVENOUS

## 2013-12-25 MED ORDER — ONDANSETRON HCL 4 MG/2ML IJ SOLN
INTRAMUSCULAR | Status: DC | PRN
  Administered 2013-12-25: 4 mg via INTRAVENOUS

## 2013-12-25 MED ORDER — ONDANSETRON HCL 4 MG/2ML IJ SOLN
INTRAMUSCULAR | Status: AC
  Filled 2013-12-25: qty 2

## 2013-12-25 MED ORDER — FENTANYL CITRATE 0.05 MG/ML IJ SOLN
INTRAMUSCULAR | Status: DC | PRN
  Administered 2013-12-25: 100 ug via INTRAVENOUS

## 2013-12-25 MED ORDER — HYDROMORPHONE HCL PF 2 MG/ML IJ SOLN
INTRAMUSCULAR | Status: AC
  Filled 2013-12-25: qty 1

## 2013-12-25 MED ORDER — METOPROLOL TARTRATE 1 MG/ML IV SOLN
INTRAVENOUS | Status: DC | PRN
  Administered 2013-12-25: 2 mg via INTRAVENOUS

## 2013-12-25 MED ORDER — FENTANYL CITRATE 0.05 MG/ML IJ SOLN
INTRAMUSCULAR | Status: AC
  Filled 2013-12-25: qty 2

## 2013-12-25 NOTE — Interval H&P Note (Signed)
History and Physical Interval Note:  12/25/2013 1:57 PM  Warren Rojas  has presented today for surgery, with the diagnosis of Abdominal pain, acute [789.00, 338.19] Elevated liver function tests [790.6] Gall stones, common bile duct [574.50]  The various methods of treatment have been discussed with the patient and family. After consideration of risks, benefits and other options for treatment, the patient has consented to  Procedure(s): UPPER ENDOSCOPIC ULTRASOUND (EUS) LINEAR (N/A) ENDOSCOPIC RETROGRADE CHOLANGIOPANCREATOGRAPHY (ERCP) (N/A) as a surgical intervention .  The patient's history has been reviewed, patient examined, no change in status, stable for surgery.  I have reviewed the patient's chart and labs.  Questions were answered to the patient's satisfaction.     Milus Banister

## 2013-12-25 NOTE — Op Note (Signed)
[facility] [place], [address]   ENDOSCOPIC ULTRASOUND PROCEDURE REPORT  PATIENT: Warren Rojas, Warren Rojas  MR#: [id number] BIRTHDATE: May 21, 1940  GENDER: Male ENDOSCOPIST: Milus Banister, MD REFERRED BY:  Gatha Mayer, M.D, [facility] PROCEDURE DATE:  12/25/2013 PROCEDURE:   Upper EUS ASA CLASS:      Class II INDICATIONS:   acute pancreatitis 09/2013 with elevated liver tests, normal [place], normal CT scan. MEDICATIONS: GA, administered by CRNA  DESCRIPTION OF PROCEDURE:   After the risks benefits and alternatives of the procedure were  explained, informed consent was obtained. The patient was then placed in the left, lateral, decubitus postion and IV sedation was administered. Throughout the procedure, the patients blood pressure, pulse and oxygen saturations were monitored continuously.  Under direct visualization, the Pentax EUS Radial [id number]  endoscope was introduced through the mouth  and advanced to the second portion of the duodenum .  Water was used as necessary to provide an acoustic interface.  Upon completion of the imaging, water was removed and the patient was sent to the recovery room in satisfactory condition.    Endoscopic findings (limited views with radial echoendoscope): 1. Normal UGI tract  EUS findings: 1. CBD was normal appearing: 4.63mm diameter, contained no stones or sludge 2. Main pancreatic duct was normal; non-dilated 3. Small perimpullary duodenal diverticulum (hyperechoic air artifact) 4. Pancreatic parenchyma was normal in head, neck, body and tail 5. Limited views of gallbladder, liver, spleen, portal and splenic vessels were all normal.  Impression: No clear etiology for his 09/2013 mild acute pancreatitis.  Would follow clinically and if he has repeat unexplained pancreatitis, should consider gallbadder resection for potential "microlithiasis."   _______________________________ eSigned:  Milus Banister, MD  12/25/2013 3:05 PM

## 2013-12-25 NOTE — Discharge Instructions (Signed)
YOU HAD AN ENDOSCOPIC PROCEDURE TODAY: Refer to the procedure report that was given to you for any specific questions about what was found during the examination.  If the procedure report does not answer your questions, please call your gastroenterologist to clarify. ° °YOU SHOULD EXPECT: Some feelings of bloating in the abdomen. Passage of more gas than usual.  Walking can help get rid of the air that was put into your GI tract during the procedure and reduce the bloating. If you had a lower endoscopy (such as a colonoscopy or flexible sigmoidoscopy) you may notice spotting of blood in your stool or on the toilet paper.  ° °DIET: Your first meal following the procedure should be a light meal and then it is ok to progress to your normal diet.  A half-sandwich or bowl of soup is an example of a good first meal.  Heavy or fried foods are harder to digest and may make you feel nasueas or bloated.  Drink plenty of fluids but you should avoid alcoholic beverages for 24 hours. ° °ACTIVITY: Your care partner should take you home directly after the procedure.  You should plan to take it easy, moving slowly for the rest of the day.  You can resume normal activity the day after the procedure however you should NOT DRIVE or use heavy machinery for 24 hours (because of the sedation medicines used during the test).   ° °SYMPTOMS TO REPORT IMMEDIATELY  °A gastroenterologist can be reached at any hour.  Please call your doctor's office for any of the following symptoms: ° °· Following lower endoscopy (colonoscopy, flexible sigmoidoscopy) ° Excessive amounts of blood in the stool ° Significant tenderness, worsening of abdominal pains ° Swelling of the abdomen that is new, acute ° Fever of 100° or higher °· Following upper endoscopy (EGD, EUS, ERCP) ° Vomiting of blood or coffee ground material ° New, significant abdominal pain ° New, significant chest pain or pain under the shoulder blades ° Painful or persistently difficult  swallowing ° New shortness of breath ° Black, tarry-looking stools ° °FOLLOW UP: °If any biopsies were taken you will be contacted by phone or by letter within the next 1-3 weeks.  Call your gastroenterologist if you have not heard about the biopsies in 3 weeks.  °Please also call your gastroenterologist's office with any specific questions about appointments or follow up tests. ° ° °General Anesthesia, Care After °Refer to this sheet in the next few weeks. These instructions provide you with information on caring for yourself after your procedure. Your health care provider may also give you more specific instructions. Your treatment has been planned according to current medical practices, but problems sometimes occur. Call your health care provider if you have any problems or questions after your procedure. °WHAT TO EXPECT AFTER THE PROCEDURE °After the procedure, it is typical to experience: °· Sleepiness. °· Nausea and vomiting. °HOME CARE INSTRUCTIONS °· For the first 24 hours after general anesthesia: °¨ Have a responsible person with you. °¨ Do not drive a car. If you are alone, do not take public transportation. °¨ Do not drink alcohol. °¨ Do not take medicine that has not been prescribed by your health care provider. °¨ Do not sign important papers or make important decisions. °¨ You may resume a normal diet and activities as directed by your health care provider. °· Change bandages (dressings) as directed. °· If you have questions or problems that seem related to general anesthesia, call the hospital and   ask for the anesthetist or anesthesiologist on call. °SEEK MEDICAL CARE IF: °· You have nausea and vomiting that continue the day after anesthesia. °· You develop a rash. °SEEK IMMEDIATE MEDICAL CARE IF:  °· You have difficulty breathing. °· You have chest pain. °· You have any allergic problems. °Document Released: 07/10/2000 Document Revised: 04/08/2013 Document Reviewed: 10/17/2012 °ExitCare® Patient  Information ©2015 [employer]. This information is not intended to replace advice given to you by your health care provider. Make sure you discuss any questions you have with your health care provider. ° °

## 2013-12-25 NOTE — Anesthesia Postprocedure Evaluation (Signed)
  Anesthesia Post-op Note  Patient: Warren Rojas  Procedure(s) Performed: Procedure(s) (LRB): UPPER ENDOSCOPIC ULTRASOUND (EUS) LINEAR (N/A)  Patient Location: PACU  Anesthesia Type: General  Level of Consciousness: awake and alert   Airway and Oxygen Therapy: Patient Spontanous Breathing  Post-op Pain: mild  Post-op Assessment: Post-op Vital signs reviewed, Patient's Cardiovascular Status Stable, Respiratory Function Stable, Patent Airway and No signs of Nausea or vomiting  Last Vitals:  Filed Vitals:   12/25/13 1511  BP: 167/72  Pulse:   Resp:     Post-op Vital Signs: stable   Complications: No apparent anesthesia complications

## 2013-12-25 NOTE — Transfer of Care (Signed)
Immediate Anesthesia Transfer of Care Note  Patient: Warren Rojas  Procedure(s) Performed: Procedure(s): UPPER ENDOSCOPIC ULTRASOUND (EUS) LINEAR (N/A)  Patient Location: PACU and Endoscopy Unit  Anesthesia Type:General  Level of Consciousness: awake, alert , oriented and patient cooperative  Airway & Oxygen Therapy: Patient Spontanous Breathing and Patient connected to face mask oxygen  Post-op Assessment: Report given to PACU RN, Post -op Vital signs reviewed and stable and Patient moving all extremities  Post vital signs: Reviewed and stable  Complications: No apparent anesthesia complications

## 2013-12-25 NOTE — H&P (View-Only) (Signed)
Subjective:    Patient ID: Warren Rojas, male    DOB: 1940-07-04, 73 y.o.   MRN: [id number]  HPI The patient is a very pleasant divorced elderly white man with a history of acute epigastric pain radiating to the back associated with nausea vomiting on June 22. He was evaluated in the common ER with ultrasound and CT scanning which were unrevealing. No gallstones. His transaminases were elevated his lipase was over 2000. He was sick for about 2436 hours was not admitted. He has been well one mild episode similar since. He has seen his internist and his LFTs and lipase are back to normal. He has a son who is a Midwife in [place], and a suggestive maybe he needs a HIDA scan with ejection fraction. He does not have heartburn or dysphagia and is generally well. Dr. Woody Seller think she ought to be considered for an upper endoscopy. GI review of systems is otherwise negative at this time.  Wt Readings from Last 3 Encounters:  12/15/13 206 lb 8 oz (93.668 kg)  10/06/13 208 lb (94.348 kg)  07/06/11 214 lb (97.07 kg)    Allergies  Allergen Reactions  . Innovar [Fentanyl-Droperidol] Anaphylaxis    Aspiration  . Cephalosporins Other (See Comments)    "Had Hepatitis from water and was advised to stay away of these medications"  . Codeine Nausea And Vomiting   Outpatient Prescriptions Prior to Visit  Medication Sig Dispense Refill  . atenolol (TENORMIN) 50 MG tablet Take 50 mg by mouth daily.        . AVODART 0.5 MG capsule Take 0.5 mg by mouth daily.       . Coenzyme Q10 (COQ-10) 100 MG CAPS Take 1 capsule by mouth daily.      . Multiple Vitamin (MULTIVITAMIN) tablet Take 1 tablet by mouth daily.        . simvastatin (ZOCOR) 20 MG tablet Take 20 mg by mouth at bedtime.       . Tamsulosin HCl (FLOMAX) 0.4 MG CAPS Take 0.4 mg by mouth 2 (two) times daily. Take one by mouth daily       No facility-administered medications prior to visit.   Past Medical History  Diagnosis Date  . Essential  hypertension, benign   . Mixed hyperlipidemia   . Coronary atherosclerosis of native coronary artery     Multivessel, normal LVEF  . Bladder outlet obstruction   . Kidney stone   . BPH (benign prostatic hypertrophy)   . Diverticulosis   . Hepatitis A 1/92  . Depression 1997  . Skin cancer   . Colon polyp 2010   Past Surgical History  Procedure Laterality Date  . Resection of urethral diverticulum  1975  . Deviated nasal septum repair  [id number]  . Tonsillectomy    . Appendectomy    . Cystoscopy    . Coronary artery bypass graft  2004    Dr. Servando Snare - LIMA to LAD, RIMA to ramus, left radial to OM, SVG to diagonal, SVG to PDA  . Colonoscopy  03/2009    tortuous colon   History   Social History  . Marital Status: Married    Spouse Name: N/A    Number of Children: 3  . Years of Education: N/A   Occupational History  . Retired     [employer] most recently   Social History Main Topics  . Smoking status: Former Smoker    Types: Cigarettes    Quit date:  04/18/1963  . Smokeless tobacco: Never Used     Comment: smoked 3 packs total while in [employer]  . Alcohol Use: No  . 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 Coronary artery disease Brother     Premature cardiovascular disease  . Heart disease Mother   . Hypertension Mother   . Hypertension Father   . Transient ischemic attack Father   . Anuerysm Father     aortic   . Pancreatic cancer Sister     Review of Systems Positive for back pain after exercise, he urinates excessively he is on Avodart for benign prosthetic hypertrophy. All other systems are negative except in the history of present illness    Objective:   Physical Exam General:  Well-developed, well-nourished and in no acute  distress Eyes:  anicteric. ENT:   Mouth and posterior pharynx free of lesions.  Neck:   supple w/o thyromegaly or mass.  Lungs: Clear to auscultation bilaterally. Heart:  S1S2, no rubs, murmurs, gallops. Abdomen:  soft, non-tender, no hepatosplenomegaly, hernia, or mass and BS+.  Lymph:  no cervical or supraclavicular adenopathy. Extremities:   no edema Skin   no rash. Neuro:  A&O x 3.  Psych:  appropriate mood and  Affect.   Data Reviewed: Primary care notes emergency room visits, images on CT and ultrasound.    Assessment & Plan:  Acute pancreatitis My sense is this was probably biliary in origin. Not sure however. We talked about an empiric cholecystectomy. He is not in favor of that and I understand. HIDA scan could be helpful but I think an endoscopic ultrasound with imaging of the bile duct, gallbladder and pancreas are appropriate given this clinical scenario and his age. I reviewed the risks of that somewhat. He understands the chart will be reviewed by Dr. Ardis Hughs, our endosonographer and a determination will be made and we will contact him.  Abnormal transaminases-transient in the setting of epigastric pain Presumably from a stone in the bile duct. These are resolved. Liver normal on CT and ultrasound.  Personal history of rectal adenoma He should have a repeat colonoscopy in December of this year or thereabouts. We'll place a recall.   I appreciate the opportunity to care for this patient. CC: VYAS,DHRUV B., MD

## 2013-12-25 NOTE — Anesthesia Preprocedure Evaluation (Addendum)
Anesthesia Evaluation  Patient identified by MRN, date of birth, ID band Patient awake    Reviewed: Allergy & Precautions, H&P , NPO status , Patient's Chart, lab work & pertinent test results, reviewed documented beta blocker date and time   Airway Mallampati: II TM Distance: >3 FB Neck ROM: Full    Dental no notable dental hx.    Pulmonary neg pulmonary ROS, former smoker,  breath sounds clear to auscultation  Pulmonary exam normal       Cardiovascular Exercise Tolerance: Good hypertension, Pt. on home beta blockers and Pt. on medications + CAD, + CABG and + Peripheral Vascular Disease negative cardio ROS  Rhythm:Regular Rate:Normal  Reports that he walks 2 miles regularly without any SOB or chest pain    Neuro/Psych PSYCHIATRIC DISORDERS Depression negative neurological ROS     GI/Hepatic negative GI ROS, Neg liver ROS, (+) Hepatitis -, A  Endo/Other  negative endocrine ROS  Renal/GU Renal diseasenegative Renal ROSnephrolithisis  negative genitourinary   Musculoskeletal negative musculoskeletal ROS (+)   Abdominal   Peds negative pediatric ROS (+)  Hematology negative hematology ROS (+)   Anesthesia Other Findings   Reproductive/Obstetrics negative OB ROS                          Anesthesia Physical Anesthesia Plan  ASA: III  Anesthesia Plan: General   Post-op Pain Management:    Induction: Intravenous  Airway Management Planned: Oral ETT  Additional Equipment:   Intra-op Plan:   Post-operative Plan: Extubation in OR  Informed Consent: I have reviewed the patients History and Physical, chart, labs and discussed the procedure including the risks, benefits and alternatives for the proposed anesthesia with the patient or authorized representative who has indicated his/her understanding and acceptance.   Dental advisory given  Plan Discussed with: CRNA  Anesthesia Plan  Comments:        Anesthesia Quick Evaluation

## 2013-12-26 ENCOUNTER — Encounter (HOSPITAL_COMMUNITY): Payer: Self-pay | Admitting: Gastroenterology

## 2014-03-30 ENCOUNTER — Encounter: Payer: Self-pay | Admitting: Internal Medicine

## 2014-08-26 ENCOUNTER — Encounter: Payer: Self-pay | Admitting: Internal Medicine

## 2015-10-10 IMAGING — CT CT ABD-PELV W/ CM
2 of 5 series · 16 of 46 positions shown, 18 images · IV contrast (omnipaque)
Comparison: None.

CLINICAL DATA: Severe epigastric pain, nausea and vomiting.

EXAM:
CT ABDOMEN AND PELVIS WITH CONTRAST
TECHNIQUE: Multidetector CT imaging of the abdomen and pelvis was performed
using the standard protocol following bolus administration of
intravenous contrast.
CONTRAST:  80mL OMNIPAQUE IOHEXOL 300 MG/ML  SOLN

[Series 2: abd/ pelvis 5.0 i30f 1 · axial · 0.78mm/px · z∈[+783,+1243]mm · 13 of 104 slices shown, 15 images]
[im 6/104  soft-tissue]
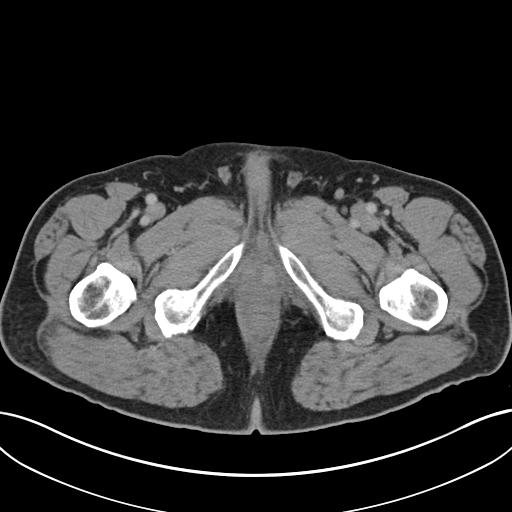
[im 6/104  bone]
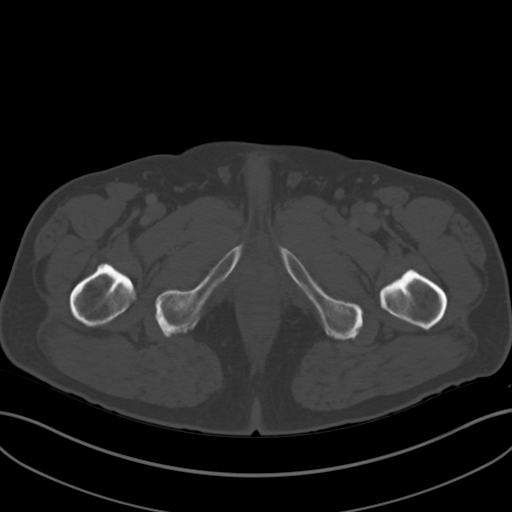
[im 16/104  soft-tissue]
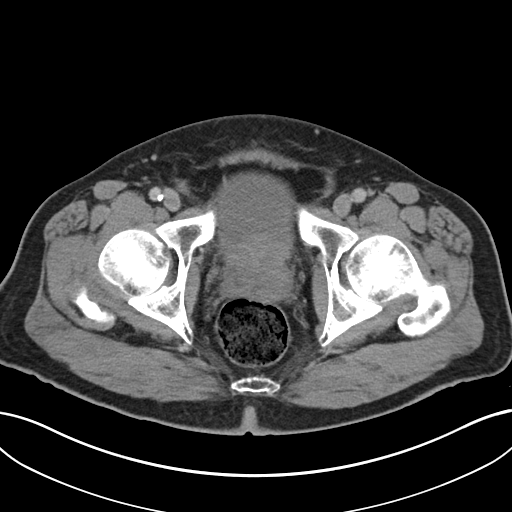
[im 21/104  soft-tissue]
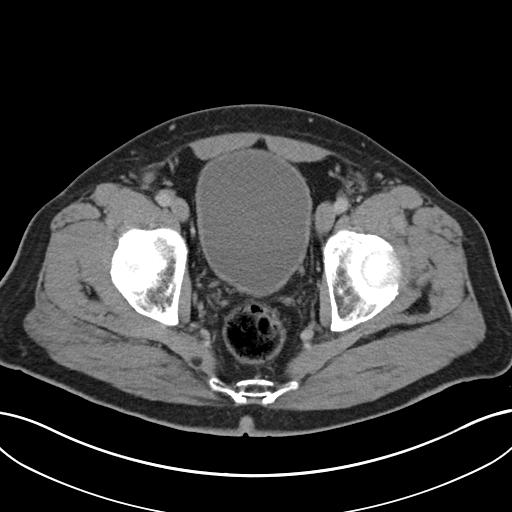
[im 31/104  soft-tissue]
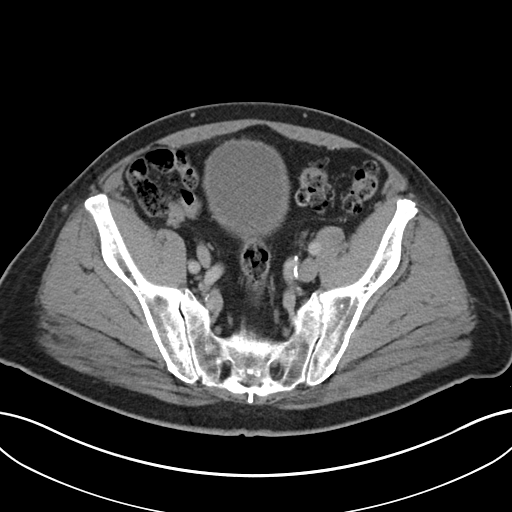
[im 37/104  soft-tissue]
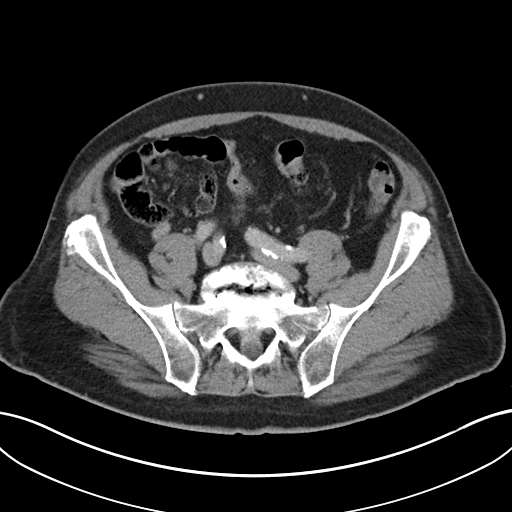
[im 47/104  soft-tissue]
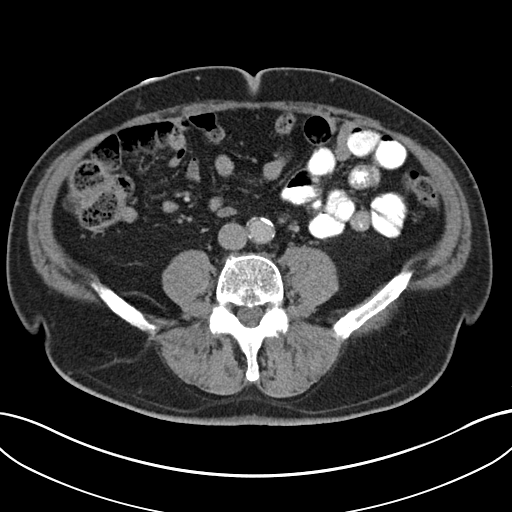
[im 52/104  soft-tissue]
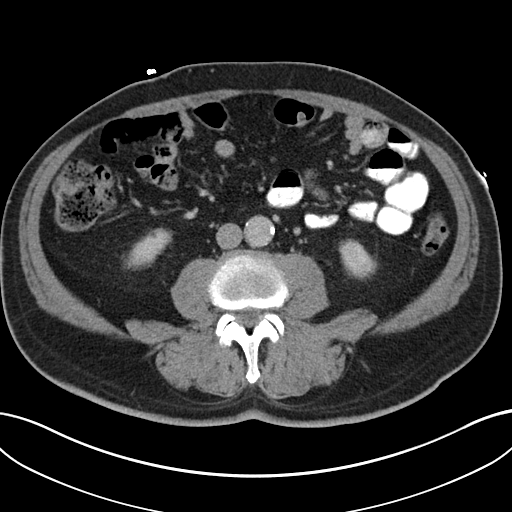
[im 57/104  soft-tissue]
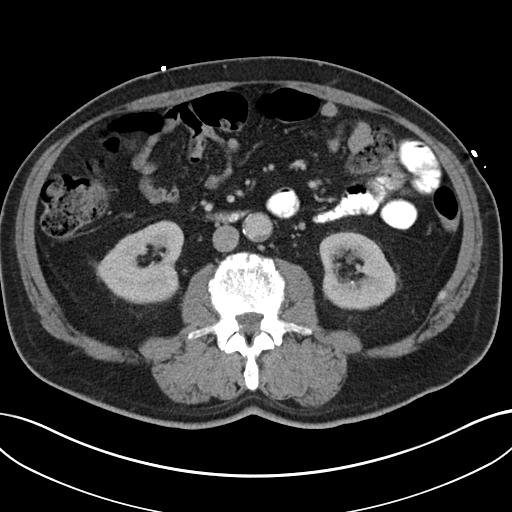
[im 67/104  soft-tissue]
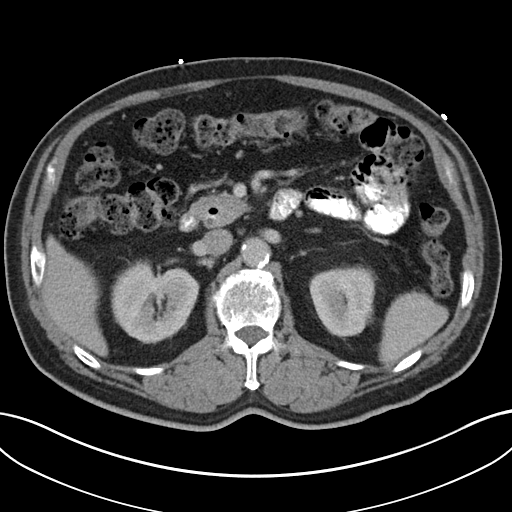
[im 67/104  bone]
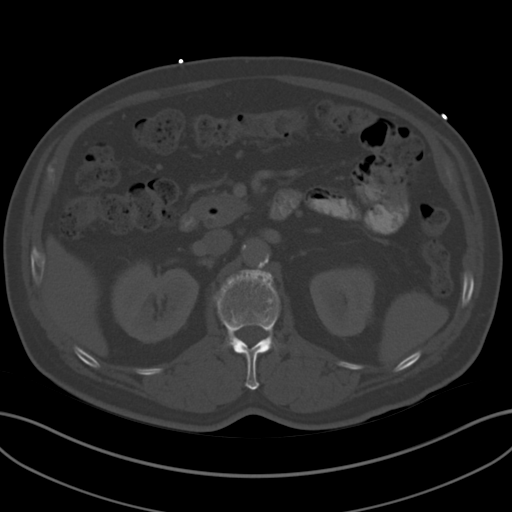
[im 73/104  soft-tissue]
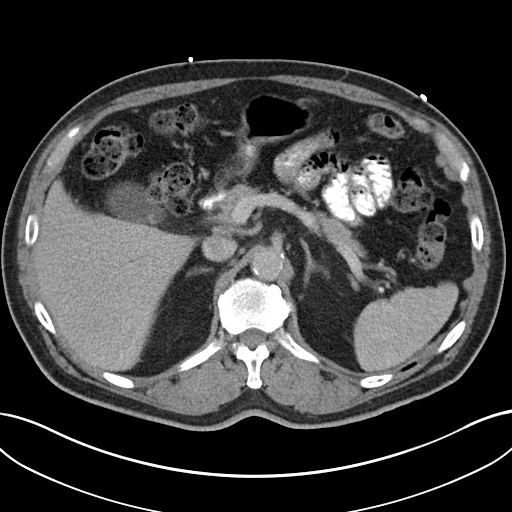
[im 83/104  soft-tissue]
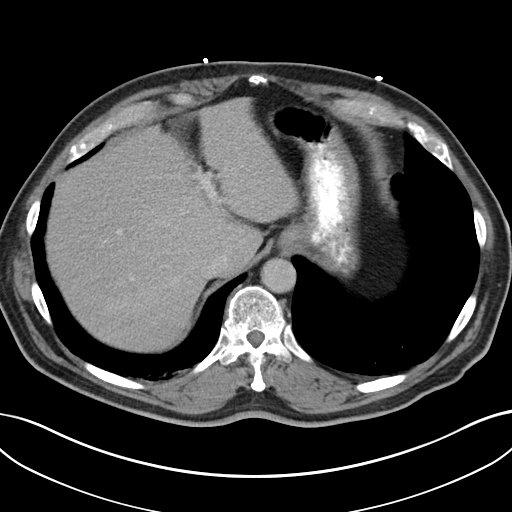
[im 88/104  soft-tissue]
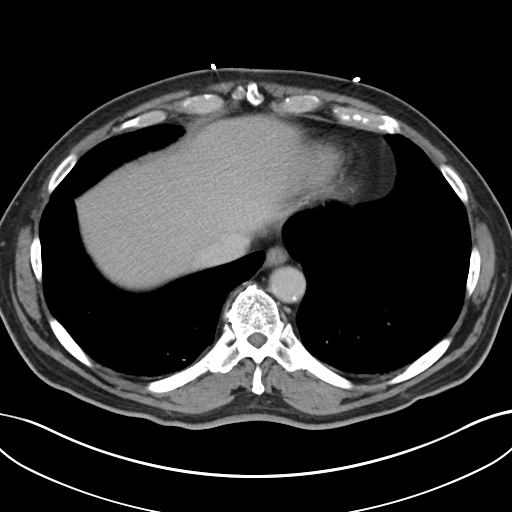
[im 98/104  soft-tissue]
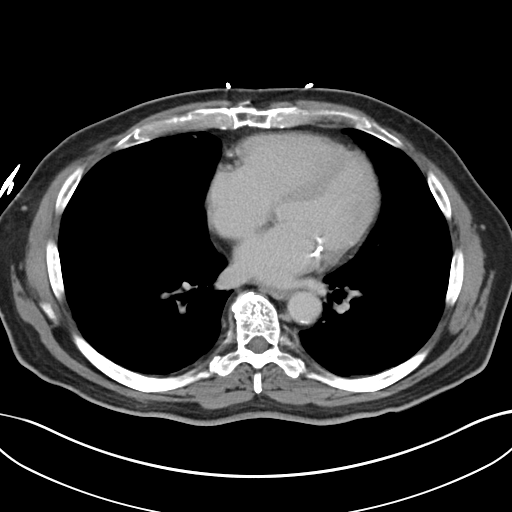

[Series 5: coronal soft tissue · coronal · 1.05mm/px · 3 of 101 slices shown]
[im 34/101  soft-tissue]
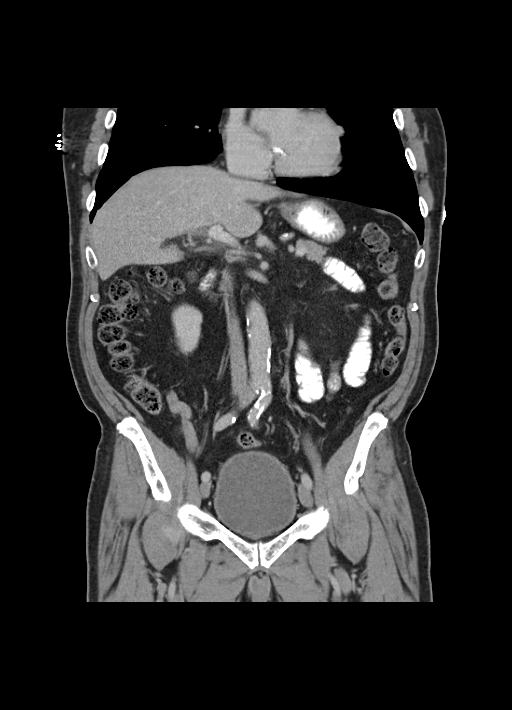
[im 45/101  soft-tissue]
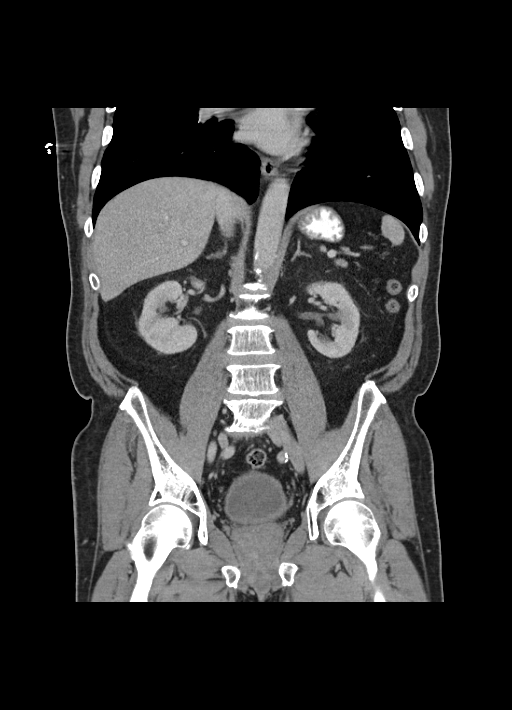
[im 56/101  soft-tissue]
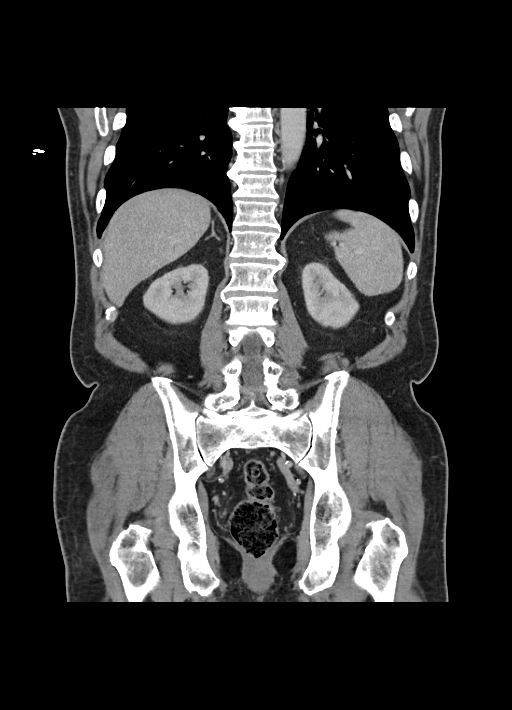

[16 of 46 positions shown; findings below may reference images not displayed]

FINDINGS: Atelectasis in the lung bases. Postoperative change in the
mediastinum. Coronary artery calcification.

The liver, spleen, gallbladder caught pancreas, adrenal glands,
kidneys, abdominal aorta, inferior vena cava, and retroperitoneal
lymph nodes are unremarkable the stomach, small bowel, and colon are
not abnormally distended. No suggestion of bowel wall thickening.
Small duodenum diverticulum. No free air or free fluid in the
abdomen.

Pelvis: Prostate gland is enlarged, measuring 6.2 x 5.6 cm. Bladder
wall is not thickened. No pelvic mass or lymphadenopathy. We
appendix is not identified. No evidence of diverticulitis. Scattered
diverticula are present in the sigmoid region. No free or loculated
pelvic fluid collections. No significant pelvic lymphadenopathy.
Degenerative changes in the spine. No destructive bone lesions are
appreciated.
IMPRESSION: No acute process demonstrated in the abdomen or pelvis that would
explain patient's symptoms. Enlarged prostate gland.

## 2020-04-15 DIAGNOSIS — I10 Essential (primary) hypertension: Secondary | ICD-10-CM | POA: Diagnosis not present

## 2020-04-15 DIAGNOSIS — E7800 Pure hypercholesterolemia, unspecified: Secondary | ICD-10-CM | POA: Diagnosis not present

## 2020-07-09 DIAGNOSIS — R413 Other amnesia: Secondary | ICD-10-CM | POA: Diagnosis not present

## 2020-07-09 DIAGNOSIS — Z6828 Body mass index (BMI) 28.0-28.9, adult: Secondary | ICD-10-CM | POA: Diagnosis not present

## 2020-07-09 DIAGNOSIS — Z299 Encounter for prophylactic measures, unspecified: Secondary | ICD-10-CM | POA: Diagnosis not present

## 2020-07-09 DIAGNOSIS — Z789 Other specified health status: Secondary | ICD-10-CM | POA: Diagnosis not present

## 2020-07-09 DIAGNOSIS — E7800 Pure hypercholesterolemia, unspecified: Secondary | ICD-10-CM | POA: Diagnosis not present

## 2020-07-09 DIAGNOSIS — I10 Essential (primary) hypertension: Secondary | ICD-10-CM | POA: Diagnosis not present

## 2021-11-09 DIAGNOSIS — N400 Enlarged prostate without lower urinary tract symptoms: Secondary | ICD-10-CM | POA: Diagnosis not present

## 2021-11-09 DIAGNOSIS — E785 Hyperlipidemia, unspecified: Secondary | ICD-10-CM | POA: Diagnosis not present

## 2021-11-09 DIAGNOSIS — R030 Elevated blood-pressure reading, without diagnosis of hypertension: Secondary | ICD-10-CM | POA: Diagnosis not present

## 2021-11-09 DIAGNOSIS — I2510 Atherosclerotic heart disease of native coronary artery without angina pectoris: Secondary | ICD-10-CM | POA: Diagnosis not present

## 2021-11-09 DIAGNOSIS — Z7982 Long term (current) use of aspirin: Secondary | ICD-10-CM | POA: Diagnosis not present

## 2022-03-27 DIAGNOSIS — E7800 Pure hypercholesterolemia, unspecified: Secondary | ICD-10-CM | POA: Diagnosis not present

## 2022-03-27 DIAGNOSIS — I10 Essential (primary) hypertension: Secondary | ICD-10-CM | POA: Diagnosis not present

## 2022-03-27 DIAGNOSIS — F0390 Unspecified dementia without behavioral disturbance: Secondary | ICD-10-CM | POA: Diagnosis not present

## 2022-03-27 DIAGNOSIS — Z789 Other specified health status: Secondary | ICD-10-CM | POA: Diagnosis not present

## 2022-03-27 DIAGNOSIS — Z7189 Other specified counseling: Secondary | ICD-10-CM | POA: Diagnosis not present

## 2022-03-27 DIAGNOSIS — Z1331 Encounter for screening for depression: Secondary | ICD-10-CM | POA: Diagnosis not present

## 2022-03-27 DIAGNOSIS — Z1339 Encounter for screening examination for other mental health and behavioral disorders: Secondary | ICD-10-CM | POA: Diagnosis not present

## 2022-03-27 DIAGNOSIS — Z0000 Encounter for general adult medical examination without abnormal findings: Secondary | ICD-10-CM | POA: Diagnosis not present

## 2022-03-27 DIAGNOSIS — Z299 Encounter for prophylactic measures, unspecified: Secondary | ICD-10-CM | POA: Diagnosis not present

## 2022-03-27 DIAGNOSIS — Z6827 Body mass index (BMI) 27.0-27.9, adult: Secondary | ICD-10-CM | POA: Diagnosis not present

## 2022-03-27 DIAGNOSIS — Z79899 Other long term (current) drug therapy: Secondary | ICD-10-CM | POA: Diagnosis not present

## 2022-03-27 DIAGNOSIS — R5383 Other fatigue: Secondary | ICD-10-CM | POA: Diagnosis not present

## 2023-01-11 DIAGNOSIS — Z0000 Encounter for general adult medical examination without abnormal findings: Secondary | ICD-10-CM | POA: Diagnosis not present

## 2023-01-11 DIAGNOSIS — Z79899 Other long term (current) drug therapy: Secondary | ICD-10-CM | POA: Diagnosis not present

## 2023-01-11 DIAGNOSIS — Z1339 Encounter for screening examination for other mental health and behavioral disorders: Secondary | ICD-10-CM | POA: Diagnosis not present

## 2023-01-11 DIAGNOSIS — Z125 Encounter for screening for malignant neoplasm of prostate: Secondary | ICD-10-CM | POA: Diagnosis not present

## 2023-01-11 DIAGNOSIS — Z1331 Encounter for screening for depression: Secondary | ICD-10-CM | POA: Diagnosis not present

## 2023-01-11 DIAGNOSIS — R5383 Other fatigue: Secondary | ICD-10-CM | POA: Diagnosis not present

## 2023-01-11 DIAGNOSIS — F0390 Unspecified dementia without behavioral disturbance: Secondary | ICD-10-CM | POA: Diagnosis not present

## 2023-01-11 DIAGNOSIS — I10 Essential (primary) hypertension: Secondary | ICD-10-CM | POA: Diagnosis not present

## 2023-01-11 DIAGNOSIS — Z299 Encounter for prophylactic measures, unspecified: Secondary | ICD-10-CM | POA: Diagnosis not present

## 2023-01-11 DIAGNOSIS — J060 Acute laryngopharyngitis: Secondary | ICD-10-CM | POA: Diagnosis not present

## 2023-01-11 DIAGNOSIS — Z7189 Other specified counseling: Secondary | ICD-10-CM | POA: Diagnosis not present

## 2023-01-11 DIAGNOSIS — E7800 Pure hypercholesterolemia, unspecified: Secondary | ICD-10-CM | POA: Diagnosis not present

## 2023-07-17 ENCOUNTER — Emergency Department (HOSPITAL_BASED_OUTPATIENT_CLINIC_OR_DEPARTMENT_OTHER)

## 2023-07-17 ENCOUNTER — Other Ambulatory Visit: Payer: Self-pay

## 2023-07-17 ENCOUNTER — Encounter (HOSPITAL_BASED_OUTPATIENT_CLINIC_OR_DEPARTMENT_OTHER): Payer: Self-pay | Admitting: Emergency Medicine

## 2023-07-17 ENCOUNTER — Inpatient Hospital Stay (HOSPITAL_BASED_OUTPATIENT_CLINIC_OR_DEPARTMENT_OTHER)
Admission: EM | Admit: 2023-07-17 | Discharge: 2023-07-20 | DRG: 660 | Disposition: A | Discharge status: Home Health | Attending: Internal Medicine | Admitting: Internal Medicine

## 2023-07-17 DIAGNOSIS — I779 Disorder of arteries and arterioles, unspecified: Secondary | ICD-10-CM | POA: Diagnosis present

## 2023-07-17 DIAGNOSIS — K8020 Calculus of gallbladder without cholecystitis without obstruction: Secondary | ICD-10-CM | POA: Diagnosis not present

## 2023-07-17 DIAGNOSIS — N179 Acute kidney failure, unspecified: Secondary | ICD-10-CM

## 2023-07-17 DIAGNOSIS — K5900 Constipation, unspecified: Secondary | ICD-10-CM | POA: Diagnosis present

## 2023-07-17 DIAGNOSIS — Z87891 Personal history of nicotine dependence: Secondary | ICD-10-CM

## 2023-07-17 DIAGNOSIS — E871 Hypo-osmolality and hyponatremia: Secondary | ICD-10-CM | POA: Insufficient documentation

## 2023-07-17 DIAGNOSIS — Z881 Allergy status to other antibiotic agents status: Secondary | ICD-10-CM

## 2023-07-17 DIAGNOSIS — I10 Essential (primary) hypertension: Secondary | ICD-10-CM | POA: Diagnosis not present

## 2023-07-17 DIAGNOSIS — Z7982 Long term (current) use of aspirin: Secondary | ICD-10-CM | POA: Diagnosis not present

## 2023-07-17 DIAGNOSIS — I2510 Atherosclerotic heart disease of native coronary artery without angina pectoris: Secondary | ICD-10-CM | POA: Diagnosis present

## 2023-07-17 DIAGNOSIS — F0390 Unspecified dementia without behavioral disturbance: Secondary | ICD-10-CM | POA: Diagnosis not present

## 2023-07-17 DIAGNOSIS — N201 Calculus of ureter: Secondary | ICD-10-CM | POA: Diagnosis not present

## 2023-07-17 DIAGNOSIS — Z800 Family history of malignant neoplasm of digestive organs: Secondary | ICD-10-CM | POA: Diagnosis not present

## 2023-07-17 DIAGNOSIS — N401 Enlarged prostate with lower urinary tract symptoms: Secondary | ICD-10-CM | POA: Diagnosis present

## 2023-07-17 DIAGNOSIS — Z885 Allergy status to narcotic agent status: Secondary | ICD-10-CM

## 2023-07-17 DIAGNOSIS — Z87442 Personal history of urinary calculi: Secondary | ICD-10-CM

## 2023-07-17 DIAGNOSIS — Z87892 Personal history of anaphylaxis: Secondary | ICD-10-CM

## 2023-07-17 DIAGNOSIS — Z85828 Personal history of other malignant neoplasm of skin: Secondary | ICD-10-CM

## 2023-07-17 DIAGNOSIS — N202 Calculus of kidney with calculus of ureter: Secondary | ICD-10-CM | POA: Diagnosis not present

## 2023-07-17 DIAGNOSIS — D638 Anemia in other chronic diseases classified elsewhere: Secondary | ICD-10-CM | POA: Diagnosis not present

## 2023-07-17 DIAGNOSIS — D649 Anemia, unspecified: Secondary | ICD-10-CM | POA: Diagnosis not present

## 2023-07-17 DIAGNOSIS — R1900 Intra-abdominal and pelvic swelling, mass and lump, unspecified site: Secondary | ICD-10-CM | POA: Diagnosis not present

## 2023-07-17 DIAGNOSIS — R4189 Other symptoms and signs involving cognitive functions and awareness: Secondary | ICD-10-CM | POA: Diagnosis present

## 2023-07-17 DIAGNOSIS — N136 Pyonephrosis: Secondary | ICD-10-CM | POA: Diagnosis not present

## 2023-07-17 DIAGNOSIS — Z951 Presence of aortocoronary bypass graft: Secondary | ICD-10-CM

## 2023-07-17 DIAGNOSIS — R5383 Other fatigue: Secondary | ICD-10-CM | POA: Diagnosis not present

## 2023-07-17 DIAGNOSIS — Z8619 Personal history of other infectious and parasitic diseases: Secondary | ICD-10-CM

## 2023-07-17 DIAGNOSIS — N139 Obstructive and reflux uropathy, unspecified: Secondary | ICD-10-CM | POA: Diagnosis present

## 2023-07-17 DIAGNOSIS — Z79899 Other long term (current) drug therapy: Secondary | ICD-10-CM

## 2023-07-17 DIAGNOSIS — Z299 Encounter for prophylactic measures, unspecified: Secondary | ICD-10-CM | POA: Diagnosis not present

## 2023-07-17 DIAGNOSIS — Z888 Allergy status to other drugs, medicaments and biological substances status: Secondary | ICD-10-CM

## 2023-07-17 DIAGNOSIS — R338 Other retention of urine: Secondary | ICD-10-CM | POA: Diagnosis present

## 2023-07-17 DIAGNOSIS — K5730 Diverticulosis of large intestine without perforation or abscess without bleeding: Secondary | ICD-10-CM | POA: Diagnosis not present

## 2023-07-17 DIAGNOSIS — N200 Calculus of kidney: Secondary | ICD-10-CM

## 2023-07-17 DIAGNOSIS — E860 Dehydration: Secondary | ICD-10-CM | POA: Diagnosis not present

## 2023-07-17 DIAGNOSIS — E782 Mixed hyperlipidemia: Secondary | ICD-10-CM | POA: Diagnosis present

## 2023-07-17 DIAGNOSIS — Z9889 Other specified postprocedural states: Secondary | ICD-10-CM

## 2023-07-17 DIAGNOSIS — N320 Bladder-neck obstruction: Secondary | ICD-10-CM | POA: Diagnosis present

## 2023-07-17 DIAGNOSIS — Z9049 Acquired absence of other specified parts of digestive tract: Secondary | ICD-10-CM

## 2023-07-17 DIAGNOSIS — N210 Calculus in bladder: Secondary | ICD-10-CM | POA: Diagnosis present

## 2023-07-17 DIAGNOSIS — Z9089 Acquired absence of other organs: Secondary | ICD-10-CM

## 2023-07-17 DIAGNOSIS — R748 Abnormal levels of other serum enzymes: Secondary | ICD-10-CM | POA: Insufficient documentation

## 2023-07-17 DIAGNOSIS — Z8249 Family history of ischemic heart disease and other diseases of the circulatory system: Secondary | ICD-10-CM | POA: Diagnosis not present

## 2023-07-17 DIAGNOSIS — N132 Hydronephrosis with renal and ureteral calculous obstruction: Secondary | ICD-10-CM | POA: Diagnosis not present

## 2023-07-17 DIAGNOSIS — Z8719 Personal history of other diseases of the digestive system: Secondary | ICD-10-CM

## 2023-07-17 DIAGNOSIS — R32 Unspecified urinary incontinence: Secondary | ICD-10-CM | POA: Diagnosis not present

## 2023-07-17 DIAGNOSIS — Z860100 Personal history of colon polyps, unspecified: Secondary | ICD-10-CM

## 2023-07-17 DIAGNOSIS — Z823 Family history of stroke: Secondary | ICD-10-CM

## 2023-07-17 LAB — COMPREHENSIVE METABOLIC PANEL WITH GFR
ALT: 11 U/L (ref 0–44)
AST: 14 U/L — ABNORMAL LOW (ref 15–41)
Albumin: 4.2 g/dL (ref 3.5–5.0)
Alkaline Phosphatase: 67 U/L (ref 38–126)
Anion gap: 12 (ref 5–15)
BUN: 72 mg/dL — ABNORMAL HIGH (ref 8–23)
CO2: 24 mmol/L (ref 22–32)
Calcium: 9 mg/dL (ref 8.9–10.3)
Chloride: 98 mmol/L (ref 98–111)
Creatinine, Ser: 3.31 mg/dL — ABNORMAL HIGH (ref 0.61–1.24)
GFR, Estimated: 18 mL/min — ABNORMAL LOW (ref >60)
Glucose, Bld: 109 mg/dL — ABNORMAL HIGH (ref 70–99)
Potassium: 4.3 mmol/L (ref 3.5–5.1)
Sodium: 134 mmol/L — ABNORMAL LOW (ref 135–145)
Total Bilirubin: 0.6 mg/dL (ref 0.0–1.2)
Total Protein: 7.3 g/dL (ref 6.5–8.1)

## 2023-07-17 LAB — CBC WITH DIFFERENTIAL/PLATELET
Abs Immature Granulocytes: 0.13 10*3/uL — ABNORMAL HIGH (ref 0.00–0.07)
Basophils Absolute: 0.1 10*3/uL (ref 0.0–0.1)
Basophils Relative: 0 %
Eosinophils Absolute: 0.1 10*3/uL (ref 0.0–0.5)
Eosinophils Relative: 1 %
HCT: 34.6 % — ABNORMAL LOW (ref 39.0–52.0)
Hemoglobin: 12 g/dL — ABNORMAL LOW (ref 13.0–17.0)
Immature Granulocytes: 1 %
Lymphocytes Relative: 17 %
Lymphs Abs: 2 10*3/uL (ref 0.7–4.0)
MCH: 31.5 pg (ref 26.0–34.0)
MCHC: 34.7 g/dL (ref 30.0–36.0)
MCV: 90.8 fL (ref 80.0–100.0)
Monocytes Absolute: 0.9 10*3/uL (ref 0.1–1.0)
Monocytes Relative: 8 %
Neutro Abs: 8.4 10*3/uL — ABNORMAL HIGH (ref 1.7–7.7)
Neutrophils Relative %: 73 %
Platelets: 372 10*3/uL (ref 150–400)
RBC: 3.81 MIL/uL — ABNORMAL LOW (ref 4.22–5.81)
RDW: 11.6 % (ref 11.5–15.5)
WBC: 11.5 10*3/uL — ABNORMAL HIGH (ref 4.0–10.5)
nRBC: 0 % (ref 0.0–0.2)

## 2023-07-17 LAB — URINALYSIS, ROUTINE W REFLEX MICROSCOPIC
Bacteria, UA: NONE SEEN
Bilirubin Urine: NEGATIVE
Glucose, UA: NEGATIVE mg/dL
Ketones, ur: NEGATIVE mg/dL
Nitrite: NEGATIVE
Protein, ur: NEGATIVE mg/dL
Specific Gravity, Urine: 1.007 (ref 1.005–1.030)
pH: 5 (ref 5.0–8.0)

## 2023-07-17 LAB — BASIC METABOLIC PANEL WITH GFR
Anion gap: 10 (ref 5–15)
BUN: 67 mg/dL — ABNORMAL HIGH (ref 8–23)
CO2: 26 mmol/L (ref 22–32)
Calcium: 9 mg/dL (ref 8.9–10.3)
Chloride: 101 mmol/L (ref 98–111)
Creatinine, Ser: 3 mg/dL — ABNORMAL HIGH (ref 0.61–1.24)
GFR, Estimated: 20 mL/min — ABNORMAL LOW (ref >60)
Glucose, Bld: 110 mg/dL — ABNORMAL HIGH (ref 70–99)
Potassium: 3.9 mmol/L (ref 3.5–5.1)
Sodium: 137 mmol/L (ref 135–145)

## 2023-07-17 LAB — LIPASE, BLOOD: Lipase: 166 U/L — ABNORMAL HIGH (ref 11–51)

## 2023-07-17 LAB — MAGNESIUM: Magnesium: 2.1 mg/dL (ref 1.7–2.4)

## 2023-07-17 MED ORDER — TAMSULOSIN HCL 0.4 MG PO CAPS
0.4000 mg | ORAL_CAPSULE | ORAL | Status: AC
  Administered 2023-07-17: 0.4 mg via ORAL
  Filled 2023-07-17: qty 1

## 2023-07-17 MED ORDER — LACTATED RINGERS IV BOLUS
1000.0000 mL | Freq: Once | INTRAVENOUS | Status: AC
  Administered 2023-07-17: 1000 mL via INTRAVENOUS

## 2023-07-17 MED ORDER — ONDANSETRON HCL 4 MG/2ML IJ SOLN
4.0000 mg | Freq: Once | INTRAMUSCULAR | Status: AC
  Administered 2023-07-17: 4 mg via INTRAVENOUS
  Filled 2023-07-17: qty 2

## 2023-07-17 MED ORDER — SODIUM CHLORIDE 0.9 % IV SOLN: Freq: Once | INTRAVENOUS | Status: AC

## 2023-07-17 MED ORDER — LORAZEPAM 2 MG/ML IJ SOLN
0.5000 mg | Freq: Once | INTRAMUSCULAR | Status: AC
  Administered 2023-07-17: 0.5 mg via INTRAVENOUS
  Filled 2023-07-17: qty 1

## 2023-07-17 NOTE — ED Triage Notes (Signed)
 Hx dementia Abdominal pain started last night Incontinence started last week Seen by PCP palpitated mass in abdo  order ct but  presented to the ed for pain control and imaging

## 2023-07-17 NOTE — ED Notes (Signed)
 Report given to the floor RN.

## 2023-07-17 NOTE — ED Notes (Signed)
Report attempted to the floor RN.Marland Kitchen

## 2023-07-17 NOTE — ED Notes (Addendum)
 Due to Pt hx of Urethra/Prostate issues, they recommended Cauda Cath... Provider informed and he was going to place... Everything the Provider needs is in the room... Provider informed.Marland KitchenMarland Kitchen

## 2023-07-17 NOTE — ED Provider Notes (Signed)
 [place] EMERGENCY DEPARTMENT AT [facility] Provider Note   CSN: [id number] Arrival date & time: 07/17/23  1449     History  Chief Complaint  Patient presents with   Abdominal Pain    Warren Rojas is a 83 y.o. male.  83 year old male with a history of dementia, gallstones, urethral stricture, BPH, and CABG presents emergency department with abdominal pain and distention.  History obtained per the patient and his sons.  Reports that over the past week his father's had decreased p.o. appetite.  Also having increased episodes of urinary incontinence.  Yesterday started complaining of abdominal pain.  Went to his primary doctor who palpated a mass and referred him to the emergency department for CT scan.  No nausea or vomiting or diarrhea that is known.  Believe that the patient is constipated but unsure when his last bowel movement was.  Not on blood thinners.  No abdominal surgeries.  Lives at home by himself.       Home Medications Prior to Admission medications   Medication Sig Start Date End Date Taking? Authorizing Provider  aspirin 325 MG tablet Take 325 mg by mouth daily.    [provider]  atenolol (TENORMIN) 50 MG tablet Take 50 mg by mouth every morning.     [provider]  AVODART 0.5 MG capsule Take 0.5 mg by mouth daily.  09/14/13   [provider]  Coenzyme Q10 (COQ-10) 100 MG CAPS Take 1 capsule by mouth daily.    [provider]  Glucosamine-Chondroitin (OSTEO BI-FLEX REGULAR STRENGTH PO) Take 1-2 tablets by mouth 2 (two) times daily.     [provider]  Multiple Vitamin (MULTIVITAMIN) tablet Take 1 tablet by mouth daily.      [provider]  naproxen sodium (ANAPROX) 220 MG tablet Take 440 mg by mouth 2 (two) times daily as needed (for pain).    [provider]  Omega-3 Fatty Acids (FISH OIL) 1200 MG CAPS Take 1 capsule by mouth 2 (two) times daily.    [provider]  simvastatin  (ZOCOR) 20 MG tablet Take 20 mg by mouth at bedtime.     [provider]  Tamsulosin HCl (FLOMAX) 0.4 MG CAPS Take 0.4 mg by mouth 2 (two) times daily.     [provider]      Allergies    Innovar [fentanyl-droperidol], Cephalosporins, and Codeine    Review of Systems   Review of Systems  Physical Exam Updated Vital Signs BP (!) 145/62 (BP Location: Left Arm)   Pulse 69   Temp 97.9 F (36.6 C) (Oral)   Resp 14   SpO2 99%  Physical Exam Vitals and nursing note reviewed.  Constitutional:      General: He is not in acute distress.    Appearance: He is well-developed.  HENT:     Head: Normocephalic and atraumatic.     Right Ear: External ear normal.     Left Ear: External ear normal.     Nose: Nose normal.  Eyes:     Extraocular Movements: Extraocular movements intact.     Conjunctiva/sclera: Conjunctivae normal.     Pupils: Pupils are equal, round, and reactive to light.  Cardiovascular:     Rate and Rhythm: Normal rate and regular rhythm.     Heart sounds: Normal heart sounds.  Pulmonary:     Effort: Pulmonary effort is normal. No respiratory distress.  Abdominal:     General: There is distension.  Palpations: Abdomen is soft. There is mass (Periumbilical very large).     Tenderness: There is no abdominal tenderness. There is no guarding.  Musculoskeletal:     Cervical back: Normal range of motion and neck supple.  Skin:    General: Skin is warm and dry.  Neurological:     Mental Status: He is alert. Mental status is at baseline.  Psychiatric:        Mood and Affect: Mood normal.        Behavior: Behavior normal.     ED Results / Procedures / Treatments   Labs (all labs ordered are listed, but only abnormal results are displayed) Labs Reviewed  COMPREHENSIVE METABOLIC PANEL WITH GFR - Abnormal; Notable for the following components:      Result Value   Sodium 134 (*)    Glucose, Bld 109 (*)    BUN 72 (*)    Creatinine, Ser 3.31 (*)     AST 14 (*)    GFR, Estimated 18 (*)    All other components within normal limits  LIPASE, BLOOD - Abnormal; Notable for the following components:   Lipase 166 (*)    All other components within normal limits  CBC WITH DIFFERENTIAL/PLATELET - Abnormal; Notable for the following components:   WBC 11.5 (*)    RBC 3.81 (*)    Hemoglobin 12.0 (*)    HCT 34.6 (*)    Neutro Abs 8.4 (*)    Abs Immature Granulocytes 0.13 (*)    All other components within normal limits  URINALYSIS, ROUTINE W REFLEX MICROSCOPIC - Abnormal; Notable for the following components:   Hgb urine dipstick SMALL (*)    Leukocytes,Ua LARGE (*)    All other components within normal limits  BASIC METABOLIC PANEL WITH GFR  MAGNESIUM  BASIC METABOLIC PANEL WITH GFR  BASIC METABOLIC PANEL WITH GFR    EKG None  Radiology CT ABDOMEN PELVIS WO CONTRAST Result Date: 07/17/2023 CLINICAL DATA:  Abdominal pain.  Urinary retention. EXAM: CT ABDOMEN AND PELVIS WITHOUT CONTRAST TECHNIQUE: Multidetector CT imaging of the abdomen and pelvis was performed following the standard protocol without IV contrast. RADIATION DOSE REDUCTION: This exam was performed according to the departmental dose-optimization program which includes automated exposure control, adjustment of the mA and/or kV according to patient size and/or use of iterative reconstruction technique. COMPARISON:  CT abdomen pelvis dated 10/07/2013. FINDINGS: Evaluation of this exam is limited in the absence of intravenous contrast. Lower chest: The visualized lung bases are clear. There is coronary vascular calcification and calcification of the mitral annulus. No intra-abdominal free air or free fluid. Hepatobiliary: The liver is unremarkable. No biliary dilatation. Layering small stones in the gallbladder. No pericholecystic fluid or evidence of acute cholecystitis by CT. Pancreas: Unremarkable. No pancreatic ductal dilatation or surrounding inflammatory changes. Spleen: Normal in  size without focal abnormality. Adrenals/Urinary Tract: The adrenal glands are unremarkable. Mild bilateral hydronephrosis, right greater than left. There is mild right hydroureter. No stones noted in the kidneys. The urinary bladder is decompressed around a Foley catheter. Multiple stones noted within the urinary bladder. There is a 6 mm partially obstructing stone in the distal right ureter. Three adjacent stones along the right posterior bladder wall measuring up to 4 mm may be within the bladder lumen or at the ureterovesical junction. There is diffuse thickening of the bladder wall which may be partly related to underdistention. Cystitis is not excluded. Correlation with urinalysis recommended. Stomach/Bowel: There is sigmoid diverticulosis. There is  no bowel obstruction or active inflammation. The appendix is not visualized with certainty. No inflammatory changes identified in the right lower quadrant. Vascular/Lymphatic: Moderate aortoiliac atherosclerotic disease. The IVC is unremarkable. No portal venous gas. There is no adenopathy. Reproductive: The prostate and seminal vesicles are grossly unremarkable. No pelvic mass. Other: None Musculoskeletal: Osteopenia with degenerative changes of the spine. No acute osseous pathology. IMPRESSION: 1. Mild bilateral hydronephrosis, right greater than left. A 6 mm partially obstructing stone in the distal right ureter. Multiple bladder calculi. Three adjacent stones along the right posterior bladder wall measuring up to 4 mm may be within the bladder lumen or at the ureterovesical junction. 2. Sigmoid diverticulosis. No bowel obstruction. 3. Cholelithiasis. Electronically Signed   By: Elgie Collard M.D.   On: 07/17/2023 18:17    Procedures BLADDER CATHETERIZATION  Date/Time: 07/17/2023 4:41 PM  Performed by: Rondel Baton, MD Authorized by: Rondel Baton, MD   Consent:    Consent obtained:  Verbal   Consent given by:  Patient  (son) Pre-procedure details:    Procedure purpose:  Therapeutic   Preparation: Patient was prepped and draped in usual sterile fashion   Procedure details:    Provider performed due to:  Altered anatomy   Altered anatomy:  Urethral stricture   Catheter insertion:  Indwelling   Catheter type:  Coude   Catheter size:  18 Fr   Number of attempts:  1   Urine characteristics:  Cloudy Post-procedure details:    Procedure completion:  Tolerated well, no immediate complications Comments:     Chaperoned by paramedic joshua     Medications Ordered in ED Medications  0.9 %  sodium chloride infusion (has no administration in time range)  lactated ringers bolus 1,000 mL (has no administration in time range)  lactated ringers bolus 1,000 mL (0 mLs Intravenous Stopped 07/17/23 1942)  LORazepam (ATIVAN) injection 0.5 mg (0.5 mg Intravenous Given 07/17/23 1813)  ondansetron (ZOFRAN) injection 4 mg (4 mg Intravenous Given 07/17/23 1812)  tamsulosin (FLOMAX) capsule 0.4 mg (0.4 mg Oral Given 07/17/23 1853)    ED Course/ Medical Decision Making/ A&P Clinical Course as of 07/17/23 2006  Tue Jul 17, 2023  1655 Foley catheter has put out 2800 mL of urine [RP]  1710 Creatinine(!): 3.31 Baseline 0.9 nine years ago.  No more recent for comparison. [RP]  1833 Dr Jerald Kief from urology recommends admission to [facility] long.  Recommends n.p.o. at midnight for possible stent placement. [RP]  1910 Dr. Janalyn Shy from hospitalist will need to call back because she is responding to a code. [RP]  1937 6,000 ml of urine output so far.  [RP]  1957 Dr Janalyn Shy from hospitalist to admit.  [RP]    Clinical Course User Index [RP] Rondel Baton, MD                                 Medical Decision Making Amount and/or Complexity of Data Reviewed Labs: ordered. Decision-making details documented in ED Course. Radiology: ordered.  Risk Prescription drug management. Decision regarding  hospitalization.   Warren Rojas is a 83 y.o. male with comorbidities that complicate the patient evaluation including dementia, gallstones, urethral stricture, BPH, and CABG presents emergency department with abdominal pain and distention.    Initial Ddx:  Urinary outlet obstruction, malignancy, SBO, stercoral colitis  MDM/Course:  Patient presents emergency department with abdominal mass and urinary incontinence.  No significant  nausea or vomiting.  Said constipation but no diarrhea.  Had a bladder scan that showed undetectably high amount of urine in his bladder.  Foley catheter was placed and initially had 2.8 L of urine output.  Continued to drain while he was in the emergency department and eventually had approximately 6 L of urine output.  Was given IV fluids to replace.  Urinalysis does not appear to be consistent with UTI.  Had a CT scan that shows multiple stones in his bladder which I suspect initially caused his bladder outlet obstruction.  He also has a distal 6 mm right-sided stone.  Was discussed with urology who recommended admission to [facility] and n.p.o. at midnight for possible stent placement in his right ureter.  Discussed with hospitalist who will admit the patient.  Upon re-evaluation was stable.  This patient presents to the ED for concern of complaints listed in HPI, this involves an extensive number of treatment options, and is a complaint that carries with it a high risk of complications and morbidity. Disposition including potential need for admission considered.   Dispo: Admit to Floor  Additional history obtained from son Records reviewed Outpatient Clinic Notes The following labs were independently interpreted: Chemistry and show AKI I independently reviewed the following imaging with scope of interpretation limited to determining acute life threatening conditions related to emergency care: CT Abdomen/Pelvis and agree with the radiologist interpretation with the  following exceptions: none I personally reviewed and interpreted cardiac monitoring: normal sinus rhythm  I personally reviewed and interpreted the pt's EKG: see above for interpretation  I have reviewed the patients home medications and made adjustments as needed Consults: Hospitalist and Urology Social Determinants of health:  geriatric  Portions of this note were generated with Scientist, clinical (histocompatibility and immunogenetics). Dictation errors may occur despite best attempts at proofreading.     Final Clinical Impression(s) / ED Diagnoses Final diagnoses:  Bladder stone  Kidney stone  Bladder outlet obstruction    Rx / DC Orders ED Discharge Orders     None         Rondel Baton, MD 07/17/23 2006

## 2023-07-17 NOTE — Treatment Plan (Signed)
 83 year old male with history of dementia, gallstones, urethral stricture s/p endoscopic repair prior to 2018, BPH and CABG who presents with urinary retention found to have bilateral hydronephrosis on CT scan as well as right obstructing distal ureteral stones and multiple bladder stones.   On arrival to the ED, patient was hypertensive but otherwise hemodynamically stable.  Bladder scan with greater than a liter.  He has since put out over 3 L in 4 hours since Foley catheter was placed.  Labs show elevated creatinine of 3.31 from a baseline of 0.89 however this was from 2015.  I do not have any recent labs.  White blood cell count mildly elevated to 11.5.  Urinalysis with small hemoglobin, large leukocyte Estrace but negative nitrite.  There are 21-50 WBCs but no bacteria seen.  Less concerning for infection and more likely sterile pyuria.  He is a patient of Dr. Ronne Binning. Last seen in office in 12/2016  At this time, given patient's urinary retention and diuresis, concern for postobstructive diuresis.  Would recommend the following:  Recommend transfer from [facility] and admit to [facility] medicine service Formal consult by urology in the a.m. Continue Foley catheter for at least 2 weeks Monitor for postobstructive diuresis.  Trend creatinine as well as electrolytes.  Management per primary team Make n.p.o. at midnight for potential right ureteral stent placement.  Given concern for elevated creatinine with right ureteral stone even if partially obstructing, would require decompression. Patient will require follow-up for definitive stone surgery as well as management of urinary retention.

## 2023-07-17 NOTE — Plan of Care (Addendum)
 Plan of Care Note for accepted transfer  Patient: Warren Rojas              JWJ:[id number]  DOA: 07/17/2023     Facility requesting transfer: [facility] emergency department Requesting Provider: Dr. Salena Saner on  Reason for transfer: Obstructive right ureter stone and acute kidney injury  ED triage note:Hx dementia Abdominal pain started last night Incontinence started last week Seen by PCP palpitated mass in abdo  order ct but  presented to the ed for pain control and imaging  Facility course: 83 year old man history of essential hypertension, dementia, BPH, urethral stricture s/p endoscopic repair prior to 2018, hyperlipidemia, and CAD status post CABG presented emergency department complaining of abdominal pain and distention.  History obtained per the patient and his sons.  Reports that over the past week his father's had decreased p.o. appetite.  Also having increased episodes of urinary incontinence.  Yesterday started complaining of abdominal pain.  Went to his primary doctor who palpated a mass and referred him to the emergency department for CT scan.  No nausea or vomiting or diarrhea that is known.  Believe that the patient is constipated but unsure when his last bowel movement was.  Not on blood thinners.  No abdominal surgeries.  Lives at home by himself.   Hemodynamically stable at presentation to ED. UA small hemoglobin positive and leukocyte esterase present. CBC showing leukocytosis 11.5 and H&H 12 and 34.  Unknown baseline except 9 years ago there is a CBC available on the chart. Elevated lipase 166. CMP showing low sodium 134, elevated creatinine 3.31 and low GFR 18.   CT abdomen pelvis showing: IMPRESSION: 1. Mild bilateral hydronephrosis, right greater than left. A 6 mm partially obstructing stone in the distal right ureter. Multiple bladder calculi. Three adjacent stones along the right posterior bladder wall measuring up to 4 mm may be within the bladder lumen or at the  ureterovesical junction. 2. Sigmoid diverticulosis. No bowel obstruction. 3. Cholelithiasis.   In the ED patient has been evaluated by urology recommendation is following.  At this time, given patient's urinary retention and diuresis, concern for postobstructive diuresis.  Would recommend the following:   Recommend transfer from [facility] and admit to [facility] medicine service Formal consult by urology in the a.m. Continue Foley catheter for at least 2 weeks Monitor for postobstructive diuresis.  Trend creatinine as well as electrolytes.  Management per primary team Make n.p.o. at midnight for potential right ureteral stent placement.  Given concern for elevated creatinine with right ureteral stone even if partially obstructing, would require decompression. Patient will require follow-up for definitive stone surgery as well as management of urinary retention.   Update, in the ED after placing the Foley catheter patient has total 6 L of urine output so far.  Received 1 L of NS and giving second liter of bolus now.  Need to monitor electrolytes closely to prevent postobstructive diuresis related electrolyte derangement. Urology recommended keep patient n.p.o. and plan to take 2 OR possibly tomorrow.    Hospitalist has been consulted for further management of AKI and obstructive right ureter stone.   Plan of care: The patient is accepted for admission for inpatient status to Telemetry unit, at [facility].  [facility] will assume care on arrival to accepting facility. Until arrival, care as per EDP. However, [facility] available 24/7 for questions and assistance.   Check [email or web address] for on-call coverage.   Nursing staff, please call [facility] Admits & Consults System-Wide number  under Amion on patient's arrival so appropriate admitting provider can evaluate the pt.    Author: Tereasa Coop, MD  07/17/2023  [facility]

## 2023-07-18 ENCOUNTER — Inpatient Hospital Stay (HOSPITAL_COMMUNITY)

## 2023-07-18 ENCOUNTER — Inpatient Hospital Stay (HOSPITAL_COMMUNITY): Admitting: Certified Registered Nurse Anesthetist

## 2023-07-18 ENCOUNTER — Encounter (HOSPITAL_COMMUNITY): Payer: Self-pay | Admitting: Internal Medicine

## 2023-07-18 ENCOUNTER — Encounter (HOSPITAL_COMMUNITY)
Admission: EM | Disposition: A | Discharge status: Home Health | Payer: Self-pay | Source: Home / Self Care | Attending: Internal Medicine

## 2023-07-18 DIAGNOSIS — D649 Anemia, unspecified: Secondary | ICD-10-CM | POA: Insufficient documentation

## 2023-07-18 DIAGNOSIS — N179 Acute kidney failure, unspecified: Secondary | ICD-10-CM | POA: Diagnosis not present

## 2023-07-18 DIAGNOSIS — I10 Essential (primary) hypertension: Secondary | ICD-10-CM | POA: Diagnosis not present

## 2023-07-18 DIAGNOSIS — K8020 Calculus of gallbladder without cholecystitis without obstruction: Secondary | ICD-10-CM | POA: Insufficient documentation

## 2023-07-18 DIAGNOSIS — E871 Hypo-osmolality and hyponatremia: Secondary | ICD-10-CM | POA: Insufficient documentation

## 2023-07-18 DIAGNOSIS — N139 Obstructive and reflux uropathy, unspecified: Secondary | ICD-10-CM

## 2023-07-18 DIAGNOSIS — R748 Abnormal levels of other serum enzymes: Secondary | ICD-10-CM | POA: Insufficient documentation

## 2023-07-18 DIAGNOSIS — N201 Calculus of ureter: Secondary | ICD-10-CM

## 2023-07-18 HISTORY — PX: CYSTOSCOPY/URETEROSCOPY/HOLMIUM LASER/STENT PLACEMENT: SHX6546

## 2023-07-18 LAB — CBC WITH DIFFERENTIAL/PLATELET
Abs Immature Granulocytes: 0.11 10*3/uL — ABNORMAL HIGH (ref 0.00–0.07)
Basophils Absolute: 0.1 10*3/uL (ref 0.0–0.1)
Basophils Relative: 1 %
Eosinophils Absolute: 0.1 10*3/uL (ref 0.0–0.5)
Eosinophils Relative: 1 %
HCT: 38.5 % — ABNORMAL LOW (ref 39.0–52.0)
Hemoglobin: 13 g/dL (ref 13.0–17.0)
Immature Granulocytes: 1 %
Lymphocytes Relative: 16 %
Lymphs Abs: 1.7 10*3/uL (ref 0.7–4.0)
MCH: 31.9 pg (ref 26.0–34.0)
MCHC: 33.8 g/dL (ref 30.0–36.0)
MCV: 94.6 fL (ref 80.0–100.0)
Monocytes Absolute: 0.7 10*3/uL (ref 0.1–1.0)
Monocytes Relative: 7 %
Neutro Abs: 8 10*3/uL — ABNORMAL HIGH (ref 1.7–7.7)
Neutrophils Relative %: 74 %
Platelets: 342 10*3/uL (ref 150–400)
RBC: 4.07 MIL/uL — ABNORMAL LOW (ref 4.22–5.81)
RDW: 11.6 % (ref 11.5–15.5)
WBC: 10.7 10*3/uL — ABNORMAL HIGH (ref 4.0–10.5)
nRBC: 0 % (ref 0.0–0.2)

## 2023-07-18 LAB — URINALYSIS, W/ REFLEX TO CULTURE (INFECTION SUSPECTED)
Bilirubin Urine: NEGATIVE
Glucose, UA: NEGATIVE mg/dL
Ketones, ur: NEGATIVE mg/dL
Nitrite: NEGATIVE
Protein, ur: 100 mg/dL — AB
Specific Gravity, Urine: 1.006 (ref 1.005–1.030)
WBC, UA: >50 WBC/hpf (ref 0–5)
pH: 5 (ref 5.0–8.0)

## 2023-07-18 LAB — FERRITIN: Ferritin: 527 ng/mL — ABNORMAL HIGH (ref 24–336)

## 2023-07-18 LAB — COMPREHENSIVE METABOLIC PANEL WITH GFR
ALT: 12 U/L (ref 0–44)
AST: 16 U/L (ref 15–41)
Albumin: 3.5 g/dL (ref 3.5–5.0)
Alkaline Phosphatase: 69 U/L (ref 38–126)
Anion gap: 9 (ref 5–15)
BUN: 60 mg/dL — ABNORMAL HIGH (ref 8–23)
CO2: 24 mmol/L (ref 22–32)
Calcium: 8.7 mg/dL — ABNORMAL LOW (ref 8.9–10.3)
Chloride: 108 mmol/L (ref 98–111)
Creatinine, Ser: 2.82 mg/dL — ABNORMAL HIGH (ref 0.61–1.24)
GFR, Estimated: 22 mL/min — ABNORMAL LOW
Glucose, Bld: 107 mg/dL — ABNORMAL HIGH (ref 70–99)
Potassium: 3.7 mmol/L (ref 3.5–5.1)
Sodium: 141 mmol/L (ref 135–145)
Total Bilirubin: 1 mg/dL (ref 0.0–1.2)
Total Protein: 6.8 g/dL (ref 6.5–8.1)

## 2023-07-18 LAB — RETICULOCYTES
Immature Retic Fract: 5.8 % (ref 2.3–15.9)
RBC.: 3.99 MIL/uL — ABNORMAL LOW (ref 4.22–5.81)
Retic Count, Absolute: 47.1 K/uL (ref 19.0–186.0)
Retic Ct Pct: 1.2 % (ref 0.4–3.1)

## 2023-07-18 LAB — BASIC METABOLIC PANEL WITH GFR
Anion gap: 11 (ref 5–15)
BUN: 67 mg/dL — ABNORMAL HIGH (ref 8–23)
CO2: 24 mmol/L (ref 22–32)
Calcium: 8.9 mg/dL (ref 8.9–10.3)
Chloride: 104 mmol/L (ref 98–111)
Creatinine, Ser: 3.01 mg/dL — ABNORMAL HIGH (ref 0.61–1.24)
GFR, Estimated: 20 mL/min — ABNORMAL LOW (ref >60)
Glucose, Bld: 109 mg/dL — ABNORMAL HIGH (ref 70–99)
Potassium: 4 mmol/L (ref 3.5–5.1)
Sodium: 139 mmol/L (ref 135–145)

## 2023-07-18 LAB — IRON AND TIBC
Iron: 69 ug/dL (ref 45–182)
Saturation Ratios: 26 % (ref 17.9–39.5)
TIBC: 269 ug/dL (ref 250–450)
UIBC: 200 ug/dL

## 2023-07-18 LAB — VITAMIN B12: Vitamin B-12: 341 pg/mL (ref 180–914)

## 2023-07-18 LAB — FOLATE: Folate: 6.4 ng/mL (ref >5.9)

## 2023-07-18 SURGERY — CYSTOSCOPY/URETEROSCOPY/HOLMIUM LASER/STENT PLACEMENT
Anesthesia: General | Site: Ureter | Laterality: Right

## 2023-07-18 MED ORDER — CHLORHEXIDINE GLUCONATE CLOTH 2 % EX PADS
6.0000 | MEDICATED_PAD | Freq: Every day | CUTANEOUS | Status: DC
  Administered 2023-07-19 (×2): 6 via TOPICAL

## 2023-07-18 MED ORDER — FENTANYL CITRATE (PF) 100 MCG/2ML IJ SOLN
INTRAMUSCULAR | Status: DC | PRN
  Administered 2023-07-18: 50 ug via INTRAVENOUS
  Administered 2023-07-18 (×2): 25 ug via INTRAVENOUS

## 2023-07-18 MED ORDER — ACETAMINOPHEN 10 MG/ML IV SOLN: 1000.0000 mg | Freq: Once | INTRAVENOUS | Status: DC | PRN

## 2023-07-18 MED ORDER — CHLORHEXIDINE GLUCONATE 0.12 % MT SOLN
15.0000 mL | Freq: Once | OROMUCOSAL | Status: AC
  Administered 2023-07-18: 15 mL via OROMUCOSAL

## 2023-07-18 MED ORDER — ONDANSETRON HCL 4 MG/2ML IJ SOLN
INTRAMUSCULAR | Status: DC | PRN
  Administered 2023-07-18: 4 mg via INTRAVENOUS

## 2023-07-18 MED ORDER — OXYCODONE HCL 5 MG PO TABS: 5.0000 mg | ORAL_TABLET | Freq: Once | ORAL | Status: DC | PRN

## 2023-07-18 MED ORDER — PIPERACILLIN-TAZOBACTAM 3.375 G IVPB
3.3750 g | Freq: Three times a day (TID) | INTRAVENOUS | Status: DC
  Administered 2023-07-19 – 2023-07-20 (×4): 3.375 g via INTRAVENOUS
  Filled 2023-07-18 (×4): qty 50

## 2023-07-18 MED ORDER — PROPOFOL 10 MG/ML IV BOLUS
INTRAVENOUS | Status: AC
  Filled 2023-07-18: qty 20

## 2023-07-18 MED ORDER — LORAZEPAM 2 MG/ML IJ SOLN
0.5000 mg | Freq: Three times a day (TID) | INTRAMUSCULAR | Status: DC | PRN
  Administered 2023-07-18: 0.5 mg via INTRAVENOUS
  Filled 2023-07-18: qty 1

## 2023-07-18 MED ORDER — SODIUM CHLORIDE 0.9 % IV SOLN: INTRAVENOUS | Status: DC

## 2023-07-18 MED ORDER — PIPERACILLIN-TAZOBACTAM 3.375 G IVPB
INTRAVENOUS | Status: AC
  Filled 2023-07-18: qty 50

## 2023-07-18 MED ORDER — PIPERACILLIN-TAZOBACTAM 3.375 G IVPB 30 MIN
3.3750 g | INTRAVENOUS | Status: AC
  Administered 2023-07-18: 3.375 g via INTRAVENOUS

## 2023-07-18 MED ORDER — TAMSULOSIN HCL 0.4 MG PO CAPS
0.4000 mg | ORAL_CAPSULE | Freq: Every day | ORAL | Status: DC
  Administered 2023-07-19: 0.4 mg via ORAL
  Filled 2023-07-18: qty 1

## 2023-07-18 MED ORDER — IOHEXOL 300 MG/ML  SOLN
INTRAMUSCULAR | Status: DC | PRN
  Administered 2023-07-18: 20 mL

## 2023-07-18 MED ORDER — EPHEDRINE 5 MG/ML INJ
INTRAVENOUS | Status: AC
  Filled 2023-07-18: qty 5

## 2023-07-18 MED ORDER — SODIUM CHLORIDE 0.9 % IR SOLN
Status: DC | PRN
  Administered 2023-07-18: 3000 mL

## 2023-07-18 MED ORDER — FENTANYL CITRATE PF 50 MCG/ML IJ SOSY
PREFILLED_SYRINGE | INTRAMUSCULAR | Status: AC
  Filled 2023-07-18: qty 1

## 2023-07-18 MED ORDER — FENTANYL CITRATE (PF) 100 MCG/2ML IJ SOLN
INTRAMUSCULAR | Status: AC
  Filled 2023-07-18: qty 2

## 2023-07-18 MED ORDER — DEXAMETHASONE SODIUM PHOSPHATE 10 MG/ML IJ SOLN
INTRAMUSCULAR | Status: DC | PRN
  Administered 2023-07-18: 10 mg via INTRAVENOUS

## 2023-07-18 MED ORDER — HYDRALAZINE HCL 20 MG/ML IJ SOLN: 10.0000 mg | INTRAMUSCULAR | Status: DC | PRN

## 2023-07-18 MED ORDER — PROPOFOL 10 MG/ML IV BOLUS
INTRAVENOUS | Status: DC | PRN
  Administered 2023-07-18: 20 mg via INTRAVENOUS
  Administered 2023-07-18: 140 mg via INTRAVENOUS

## 2023-07-18 MED ORDER — OXYCODONE HCL 5 MG/5ML PO SOLN: 5.0000 mg | Freq: Once | ORAL | Status: DC | PRN

## 2023-07-18 MED ORDER — LIDOCAINE HCL (PF) 2 % IJ SOLN
INTRAMUSCULAR | Status: AC
  Filled 2023-07-18: qty 5

## 2023-07-18 MED ORDER — EPHEDRINE SULFATE-NACL 50-0.9 MG/10ML-% IV SOSY
PREFILLED_SYRINGE | INTRAVENOUS | Status: DC | PRN
  Administered 2023-07-18: 2.5 mg via INTRAVENOUS
  Administered 2023-07-18: 7.5 mg via INTRAVENOUS

## 2023-07-18 MED ORDER — LIDOCAINE HCL (CARDIAC) PF 100 MG/5ML IV SOSY
PREFILLED_SYRINGE | INTRAVENOUS | Status: DC | PRN
  Administered 2023-07-18: 100 mg via INTRAVENOUS

## 2023-07-18 MED ORDER — LIDOCAINE HCL URETHRAL/MUCOSAL 2 % EX GEL
CUTANEOUS | Status: AC
  Filled 2023-07-18: qty 30

## 2023-07-18 MED ORDER — ONDANSETRON HCL 4 MG/2ML IJ SOLN: 4.0000 mg | Freq: Once | INTRAMUSCULAR | Status: DC | PRN

## 2023-07-18 MED ORDER — PIPERACILLIN-TAZOBACTAM 3.375 G IVPB 30 MIN: 3.3750 g | INTRAVENOUS | Status: AC

## 2023-07-18 MED ORDER — FENTANYL CITRATE PF 50 MCG/ML IJ SOSY
25.0000 ug | PREFILLED_SYRINGE | INTRAMUSCULAR | Status: DC | PRN
Start: 2023-07-18 — End: 2023-07-18
  Administered 2023-07-18: 50 ug via INTRAVENOUS

## 2023-07-18 SURGICAL SUPPLY — 20 items
BAG URINE DRAIN 2000ML AR STRL (UROLOGICAL SUPPLIES) IMPLANT
BAG URO CATCHER STRL LF (MISCELLANEOUS) ×1 IMPLANT
BASKET ZERO TIP NITINOL 2.4FR (BASKET) IMPLANT
CATH TIEMANN FOLEY 18FR 5CC (CATHETERS) IMPLANT
CATH URETL OPEN 5X70 (CATHETERS) IMPLANT
CLOTH BEACON ORANGE TIMEOUT ST (SAFETY) ×1 IMPLANT
GLOVE SURG LX STRL 8.0 MICRO (GLOVE) ×1 IMPLANT
GOWN SPEC L4 XLG W/TWL (GOWN DISPOSABLE) ×1 IMPLANT
GUIDEWIRE STR DUAL SENSOR (WIRE) ×1 IMPLANT
GUIDEWIRE ZIPWRE .038 STRAIGHT (WIRE) IMPLANT
KIT TURNOVER KIT A (KITS) IMPLANT
LASER FIB FLEXIVA PULSE ID 365 (Laser) IMPLANT
MANIFOLD NEPTUNE II (INSTRUMENTS) ×1 IMPLANT
PACK CYSTO (CUSTOM PROCEDURE TRAY) ×1 IMPLANT
SHEATH NAVIGATOR HD 12/14X36 (SHEATH) IMPLANT
SHEATH URETERAL FLEX 12X35 (SHEATH) IMPLANT
STENT URET 6FRX26 CONTOUR (STENTS) IMPLANT
TRACTIP FLEXIVA PULS ID 200XHI (Laser) IMPLANT
TUBING CONNECTING 10 (TUBING) ×1 IMPLANT
TUBING UROLOGY SET (TUBING) ×1 IMPLANT

## 2023-07-18 NOTE — Progress Notes (Signed)
 Patient admitted earlier this morning for abdominal discomfort and was found to have bilateral hydronephrosis with right-sided obstructing distal ureteral stone and acute kidney injury.  Foley catheter placed on admission.  Urology has been consulted.  Patient seen and examined at bedside.  I have reviewed patient's medical records including this morning's H&P, current vitals, labs, medications myself.  Continue NPO and IV fluids.  Follow urology recommendations.  Monitor renal function.

## 2023-07-18 NOTE — H&P (View-Only) (Signed)
 Urology Consult Note   Requesting Attending Physician:  Glade Lloyd, MD Service Providing Consult: Urology  Consulting Attending: Dr Pete Glatter   Reason for Consult:  bilateral hydronephrosis, AKI, and right distal ureteral stone  HPI: Warren Rojas is seen in consultation for reasons noted above at the request of Glade Lloyd, MD for evaluation of bilateral hydronephrosis, AKI, and right distal ureteral stone.  This is a 83 y.o. male with history of dementia, gallstones, urethral stricture s/p endoscopic repair prior to 2018, BPH and CABG who presents with urinary retention found to have bilateral hydronephrosis on CT scan as well as right obstructing distal ureteral stones and multiple bladder stones.     Patient with dementia so history taken from partner in the room.  Over the past week patient has been having increased urinary leakage worsened when he is lying down.  No fevers, chills or nausea or vomiting.  Also had noticed that his belly was becoming more full.  He then started eating less.  This is what prompted her to bring him in.  Patient is not currently on any blood thinning medication.  He does have a aspirin 325 mg listed in his medicines but is unclear if he takes this.  Per the list that the partner showed me, does not appear that he takes this medication.  He does take dutasteride for BPH.    Past Medical History: Past Medical History:  Diagnosis Date   Bladder outlet obstruction    BPH (benign prostatic hypertrophy)    Colon polyp 2010   Coronary atherosclerosis of native coronary artery    Multivessel, normal LVEF   Depression 1997   Diverticulosis    Essential hypertension, benign    Hepatitis A 1/92   Kidney stone    Mixed hyperlipidemia    Personal history of rectal adenoma 03/23/2009   Skin cancer     Past Surgical History:  Past Surgical History:  Procedure Laterality Date   APPENDECTOMY     COLONOSCOPY  03/2009   tortuous colon   CORONARY  ARTERY BYPASS GRAFT  2004   Dr. Tyrone Sage - LIMA to LAD, RIMA to ramus, left radial to OM, SVG to diagonal, SVG to PDA   CYSTOSCOPY     Deviated nasal septum repair  1963   EUS N/A 12/25/2013   Procedure: UPPER ENDOSCOPIC ULTRASOUND (EUS) LINEAR;  Surgeon: Rachael Fee, MD;  Location: [facility];  Service: Endoscopy;  Laterality: N/A;   Resection of urethral diverticulum  1975   TONSILLECTOMY      Medication: Current Facility-Administered Medications  Medication Dose Route Frequency Provider Last Rate Last Admin   0.9 %  sodium chloride infusion   Intravenous Continuous Eduard Clos, MD 125 mL/hr at 07/18/23 0050 New Bag at 07/18/23 0050   hydrALAZINE (APRESOLINE) injection 10 mg  10 mg Intravenous Q4H PRN Eduard Clos, MD        Allergies: Allergies  Allergen Reactions   Innovar [Fentanyl-Droperidol] Anaphylaxis    Aspiration   Cephalosporins Other (See Comments)    "Had Hepatitis from water and was advised to stay away of these medications"   Codeine Nausea And Vomiting    Social History: Social History   Tobacco Use   Smoking status: Former    Current packs/day: 0.00    Types: Cigarettes    Quit date: 04/18/1963    Years since quitting: 60.2   Smokeless tobacco: Never   Tobacco comments:    smoked 3 packs total  while in [employer]  Substance Use Topics   Alcohol use: No   Drug use: No    Family History Family History  Problem Relation Age of Onset   Coronary artery disease Brother        Premature cardiovascular disease   Heart disease Mother    Hypertension Mother    Hypertension Father    Transient ischemic attack Father    Anuerysm Father        aortic    Pancreatic cancer Sister     Review of Systems 10 systems were reviewed and are negative except as noted specifically in the HPI.  Objective   Vital signs in last 24 hours: BP (!) 117/99 (BP Location: Right Arm)   Pulse 67   Temp 98 F (36.7 C) (Oral)   Resp 14   Wt 90.8 kg    SpO2 97%   BMI 27.92 kg/m   Physical Exam General: NAD, A&O, resting, appropriate HEENT: [place]/AT, EOMI, MMM Pulmonary: Normal work of breathing Cardiovascular: HDS, adequate peripheral perfusion Abdomen: Soft, NTTP,. GU: foley in place with clear yellow urine.  Extremities: warm and well perfused Neuro: Appropriate, no focal neurological deficits  Most Recent Labs: Lab Results  Component Value Date   WBC 11.5 (H) 07/17/2023   HGB 12.0 (L) 07/17/2023   HCT 34.6 (L) 07/17/2023   PLT 372 07/17/2023    Lab Results  Component Value Date   NA 139 07/17/2023   K 4.0 07/17/2023   CL 104 07/17/2023   CO2 24 07/17/2023   BUN 67 (H) 07/17/2023   CREATININE 3.01 (H) 07/17/2023   CALCIUM 8.9 07/17/2023   MG 2.1 07/17/2023    No results found for: "INR", "APTT"   Urine Culture: @[id number] (laburin,org,[id number])@   IMAGING: CT ABDOMEN PELVIS WO CONTRAST Result Date: 07/17/2023 CLINICAL DATA:  Abdominal pain.  Urinary retention. EXAM: CT ABDOMEN AND PELVIS WITHOUT CONTRAST TECHNIQUE: Multidetector CT imaging of the abdomen and pelvis was performed following the standard protocol without IV contrast. RADIATION DOSE REDUCTION: This exam was performed according to the departmental dose-optimization program which includes automated exposure control, adjustment of the mA and/or kV according to patient size and/or use of iterative reconstruction technique. COMPARISON:  CT abdomen pelvis dated 10/07/2013. FINDINGS: Evaluation of this exam is limited in the absence of intravenous contrast. Lower chest: The visualized lung bases are clear. There is coronary vascular calcification and calcification of the mitral annulus. No intra-abdominal free air or free fluid. Hepatobiliary: The liver is unremarkable. No biliary dilatation. Layering small stones in the gallbladder. No pericholecystic fluid or evidence of acute cholecystitis by CT. Pancreas: Unremarkable. No pancreatic ductal dilatation or  surrounding inflammatory changes. Spleen: Normal in size without focal abnormality. Adrenals/Urinary Tract: The adrenal glands are unremarkable. Mild bilateral hydronephrosis, right greater than left. There is mild right hydroureter. No stones noted in the kidneys. The urinary bladder is decompressed around a Foley catheter. Multiple stones noted within the urinary bladder. There is a 6 mm partially obstructing stone in the distal right ureter. Three adjacent stones along the right posterior bladder wall measuring up to 4 mm may be within the bladder lumen or at the ureterovesical junction. There is diffuse thickening of the bladder wall which may be partly related to underdistention. Cystitis is not excluded. Correlation with urinalysis recommended. Stomach/Bowel: There is sigmoid diverticulosis. There is no bowel obstruction or active inflammation. The appendix is not visualized with certainty. No inflammatory changes identified in the right lower quadrant. Vascular/Lymphatic: Moderate aortoiliac  atherosclerotic disease. The IVC is unremarkable. No portal venous gas. There is no adenopathy. Reproductive: The prostate and seminal vesicles are grossly unremarkable. No pelvic mass. Other: None Musculoskeletal: Osteopenia with degenerative changes of the spine. No acute osseous pathology. IMPRESSION: 1. Mild bilateral hydronephrosis, right greater than left. A 6 mm partially obstructing stone in the distal right ureter. Multiple bladder calculi. Three adjacent stones along the right posterior bladder wall measuring up to 4 mm may be within the bladder lumen or at the ureterovesical junction. 2. Sigmoid diverticulosis. No bowel obstruction. 3. Cholelithiasis. Electronically Signed   By: Elgie Collard M.D.   On: 07/17/2023 18:17    ------  Assessment:  83 y.o. male with history of dementia, gallstones, urethral stricture s/p endoscopic repair prior to 2018, BPH and CABG who presents with urinary retention  found to have bilateral hydronephrosis on CT scan as well as right obstructing distal ureteral stones and multiple bladder stones.   On arrival to the ED, patient was hypertensive but otherwise hemodynamically stable.  Bladder scan with greater than a liter.  He has since put out over 3 L in 4 hours since Foley catheter was placed.   Labs show elevated creatinine of 3.31 from a baseline of 0.89 however this was from 2015.  I do not have any recent labs.  Cr went down to 3 after fluids.  White blood cell count mildly elevated to 11.5.  Urinalysis with small hemoglobin, large leukocyte Estrace but negative nitrite.  There are 21-50 WBCs but no bacteria seen.  Less concerning for infection and more likely sterile pyuria.   Given c/f distal obstructing stones, plan for likely right ureteral stent placement with possible right ureteroscopy and stone removal as well as removal of bladder stones.    Recommendations: - continue NPO status -Abx ordered for OR, possible OR today vs tomorrow pending schedule -continue foley catheter for 2 weeks.  -continue to monitor for post obstructive diuresis.  -trend Cr -Recommend starting Flomax if medically able  Plan discussed with Dr. Pete Glatter    Thank you for this consult. Please contact the urology consult pager with any further questions/concerns.

## 2023-07-18 NOTE — H&P (Addendum)
 History and Physical    Warren Rojas:[id number] DOB: [birth year] DOA: 07/17/2023  Patient coming from: Home.  Chief Complaint: Abdominal discomfort.  HPI: Warren Rojas is a 83 y.o. male with history of CAD status post CABG, prior history of ureteral stricture status post endoscopy repair, hypertension was brought to the ER after patient was complaining of abdominal discomfort mostly in the lower abdomen for the last few days.  Denies any nausea vomiting diarrhea chest pain or shortness of breath.  ED Course: In the ER patient's labs show creatinine of 3.3 which is increased from normal about in 2015.  No recent labs available.  Lipase was 166.  Bladder scan showed fluid retention and also CT scan of the abdomen showed bilateral hydronephrosis with the right sided obstructing distal ureteral stone.  UA shows some leukocyte Estrace positive WBCs but no bacteria.  Urology was consulted.  Patient was started on fluids Foley catheter was placed following which 3 L fluid was drained.  Patient admitted for acute renal failure with obstructive uropathy.  Review of Systems: As per HPI, rest all negative.   Past Medical History:  Diagnosis Date   Bladder outlet obstruction    BPH (benign prostatic hypertrophy)    Colon polyp 2010   Coronary atherosclerosis of native coronary artery    Multivessel, normal LVEF   Depression 1997   Diverticulosis    Essential hypertension, benign    Hepatitis A 1/92   Kidney stone    Mixed hyperlipidemia    Personal history of rectal adenoma 03/23/2009   Skin cancer     Past Surgical History:  Procedure Laterality Date   APPENDECTOMY     COLONOSCOPY  03/2009   tortuous colon   CORONARY ARTERY BYPASS GRAFT  2004   Dr. Tyrone Sage - LIMA to LAD, RIMA to ramus, left radial to OM, SVG to diagonal, SVG to PDA   CYSTOSCOPY     Deviated nasal septum repair  1963   EUS N/A 12/25/2013   Procedure: UPPER ENDOSCOPIC ULTRASOUND (EUS) LINEAR;  Surgeon: Rachael Fee, MD;  Location: [facility];  Service: Endoscopy;  Laterality: N/A;   Resection of urethral diverticulum  1975   TONSILLECTOMY       reports that he quit smoking about 60 years ago. His smoking use included cigarettes. He has never used smokeless tobacco. He reports that he does not drink alcohol and does not use drugs.  Allergies  Allergen Reactions   Innovar [Fentanyl-Droperidol] Anaphylaxis    Aspiration   Cephalosporins Other (See Comments)    "Had Hepatitis from water and was advised to stay away of these medications"   Codeine Nausea And Vomiting    Family History  Problem Relation Age of Onset   Coronary artery disease Brother        Premature cardiovascular disease   Heart disease Mother    Hypertension Mother    Hypertension Father    Transient ischemic attack Father    Anuerysm Father        aortic    Pancreatic cancer Sister     Prior to Admission medications   Medication Sig Start Date End Date Taking? Authorizing Provider  citalopram (CELEXA) 20 MG tablet Take 20 mg by mouth daily. 06/14/23  Yes [provider]  memantine (NAMENDA) 5 MG tablet Take 5 mg by mouth 2 (two) times daily. 06/22/23  Yes [provider]  aspirin 325 MG tablet Take 325 mg by mouth daily.  [provider]  atenolol (TENORMIN) 50 MG tablet Take 50 mg by mouth every morning.     [provider]  AVODART 0.5 MG capsule Take 0.5 mg by mouth daily.  09/14/13   [provider]  Coenzyme Q10 (COQ-10) 100 MG CAPS Take 1 capsule by mouth daily.    [provider]  Glucosamine-Chondroitin (OSTEO BI-FLEX REGULAR STRENGTH PO) Take 1-2 tablets by mouth 2 (two) times daily.     [provider]  Multiple Vitamin (MULTIVITAMIN) tablet Take 1 tablet by mouth daily.      [provider]  naproxen sodium (ANAPROX) 220 MG tablet Take 440 mg by mouth 2 (two) times daily as needed (for pain).    [provider]  Omega-3 Fatty  Acids (FISH OIL) 1200 MG CAPS Take 1 capsule by mouth 2 (two) times daily.    [provider]  simvastatin (ZOCOR) 20 MG tablet Take 20 mg by mouth at bedtime.     [provider]  Tamsulosin HCl (FLOMAX) 0.4 MG CAPS Take 0.4 mg by mouth 2 (two) times daily.     [provider]    Physical Exam: Constitutional: Moderately built and nourished. Vitals:   07/17/23 2000 07/17/23 2030 07/17/23 2100 07/17/23 2152  BP: 139/69 127/64 136/65 (!) 156/79  Pulse: 71 70  60  Resp: 16 18 16 14   Temp:    98.2 F (36.8 C)  TempSrc:    Oral  SpO2: 99% 96%  98%  Weight:    90.8 kg   Eyes: Anicteric no pallor. ENMT: No discharge from the ears or his nose or mouth. Neck: No mass felt.  No neck rigidity. Respiratory: No rhonchi or crepitations. Cardiovascular: S1-S2 heard. Abdomen: Soft nontender bowel sounds present. Musculoskeletal: No edema. Skin: No rash. Neurologic: Alert awake oriented to name and place.  Moving all extremities. Psychiatric: Oriented to name and place.   Labs on Admission: I have personally reviewed following labs and imaging studies  CBC: Recent Labs  Lab 07/17/23 1545  WBC 11.5*  NEUTROABS 8.4*  HGB 12.0*  HCT 34.6*  MCV 90.8  PLT 372   Basic Metabolic Panel: Recent Labs  Lab 07/17/23 1545 07/17/23 1938 07/17/23 2351  NA 134* 137 139  K 4.3 3.9 4.0  CL 98 101 104  CO2 24 26 24   GLUCOSE 109* 110* 109*  BUN 72* 67* 67*  CREATININE 3.31* 3.00* 3.01*  CALCIUM 9.0 9.0 8.9  MG  --  2.1  --    GFR: CrCl cannot be calculated (Unknown ideal weight.). Liver Function Tests: Recent Labs  Lab 07/17/23 1545  AST 14*  ALT 11  ALKPHOS 67  BILITOT 0.6  PROT 7.3  ALBUMIN 4.2   Recent Labs  Lab 07/17/23 1545  LIPASE 166*   No results for input(s): "AMMONIA" in the last 168 hours. Coagulation Profile: No results for input(s): "INR", "PROTIME" in the last 168 hours. Cardiac Enzymes: No results for input(s): "CKTOTAL", "CKMB",  "CKMBINDEX", "TROPONINI" in the last 168 hours. BNP (last 3 results) No results for input(s): "PROBNP" in the last 8760 hours. HbA1C: No results for input(s): "HGBA1C" in the last 72 hours. CBG: No results for input(s): "GLUCAP" in the last 168 hours. Lipid Profile: No results for input(s): "CHOL", "HDL", "LDLCALC", "TRIG", "CHOLHDL", "LDLDIRECT" in the last 72 hours. Thyroid Function Tests: No results for input(s): "TSH", "T4TOTAL", "FREET4", "T3FREE", "THYROIDAB" in the last 72 hours. Anemia Panel: No results for input(s): "VITAMINB12", "FOLATE", "FERRITIN", "TIBC", "  IRON", "RETICCTPCT" in the last 72 hours. Urine analysis:    Component Value Date/Time   COLORURINE YELLOW 07/17/2023 1642   APPEARANCEUR CLEAR 07/17/2023 1642   LABSPEC 1.007 07/17/2023 1642   PHURINE 5.0 07/17/2023 1642   GLUCOSEU NEGATIVE 07/17/2023 1642   HGBUR SMALL (A) 07/17/2023 1642   BILIRUBINUR NEGATIVE 07/17/2023 1642   KETONESUR NEGATIVE 07/17/2023 1642   PROTEINUR NEGATIVE 07/17/2023 1642   UROBILINOGEN 1.0 10/07/2013 0506   NITRITE NEGATIVE 07/17/2023 1642   LEUKOCYTESUR LARGE (A) 07/17/2023 1642   Sepsis Labs: @LABRCNTIP (procalcitonin:4,lacticidven:4) )No results found for this or any previous visit (from the past 240 hours).   Radiological Exams on Admission: CT ABDOMEN PELVIS WO CONTRAST Result Date: 07/17/2023 CLINICAL DATA:  Abdominal pain.  Urinary retention. EXAM: CT ABDOMEN AND PELVIS WITHOUT CONTRAST TECHNIQUE: Multidetector CT imaging of the abdomen and pelvis was performed following the standard protocol without IV contrast. RADIATION DOSE REDUCTION: This exam was performed according to the departmental dose-optimization program which includes automated exposure control, adjustment of the mA and/or kV according to patient size and/or use of iterative reconstruction technique. COMPARISON:  CT abdomen pelvis dated 10/07/2013. FINDINGS: Evaluation of this exam is limited in the absence of  intravenous contrast. Lower chest: The visualized lung bases are clear. There is coronary vascular calcification and calcification of the mitral annulus. No intra-abdominal free air or free fluid. Hepatobiliary: The liver is unremarkable. No biliary dilatation. Layering small stones in the gallbladder. No pericholecystic fluid or evidence of acute cholecystitis by CT. Pancreas: Unremarkable. No pancreatic ductal dilatation or surrounding inflammatory changes. Spleen: Normal in size without focal abnormality. Adrenals/Urinary Tract: The adrenal glands are unremarkable. Mild bilateral hydronephrosis, right greater than left. There is mild right hydroureter. No stones noted in the kidneys. The urinary bladder is decompressed around a Foley catheter. Multiple stones noted within the urinary bladder. There is a 6 mm partially obstructing stone in the distal right ureter. Three adjacent stones along the right posterior bladder wall measuring up to 4 mm may be within the bladder lumen or at the ureterovesical junction. There is diffuse thickening of the bladder wall which may be partly related to underdistention. Cystitis is not excluded. Correlation with urinalysis recommended. Stomach/Bowel: There is sigmoid diverticulosis. There is no bowel obstruction or active inflammation. The appendix is not visualized with certainty. No inflammatory changes identified in the right lower quadrant. Vascular/Lymphatic: Moderate aortoiliac atherosclerotic disease. The IVC is unremarkable. No portal venous gas. There is no adenopathy. Reproductive: The prostate and seminal vesicles are grossly unremarkable. No pelvic mass. Other: None Musculoskeletal: Osteopenia with degenerative changes of the spine. No acute osseous pathology. IMPRESSION: 1. Mild bilateral hydronephrosis, right greater than left. A 6 mm partially obstructing stone in the distal right ureter. Multiple bladder calculi. Three adjacent stones along the right posterior  bladder wall measuring up to 4 mm may be within the bladder lumen or at the ureterovesical junction. 2. Sigmoid diverticulosis. No bowel obstruction. 3. Cholelithiasis. Electronically Signed   By: Elgie Collard M.D.   On: 07/17/2023 18:17      Assessment/Plan Principal Problem:   ARF (acute renal failure) (HCC) Active Problems:   Coronary atherosclerosis of native coronary artery   Essential hypertension, benign   Mixed hyperlipidemia   Carotid artery disease (HCC)   Acute unilateral obstructive uropathy   Anemia   Hyponatremia    Acute renal failure with obstructive uropathy and 6 mm partially obstructing stone in the right distal ureter - urologist has been consulted.  Foley was placed.  So far more than 3 L fluid has been drained.  Continue with gentle hydration closely monitor for postobstructive diuresis.  Will keep patient n.p.o. in anticipation of possible procedure.  Multiple other stones were seen in the bladder. Elevated lipase with gallstones no signs of acute pancreatitis on the CAT scan.  Will closely monitor. Hypertension will need to confirm home medications.  For now keep patient on as needed IV hydralazine. CAD status post CABG denies any chest pain. Anemia check anemia panel follow CBC. Mild hyponatremia likely from dehydration recheck after hydration. Cognitive impairment.  Will need to confirm home medications.  Since patient has acute renal failure with obstructive uropathy may need procedure will need more than 2 midnight stay.   DVT prophylaxis: SCDs. Code Status: Full code. Family Communication: Patient's significant other is at the bedside. Disposition Plan: Medical floor. Consults called: Urology. Admission status: Observation.

## 2023-07-18 NOTE — Transfer of Care (Signed)
 Immediate Anesthesia Transfer of Care Note  Patient: Warren Rojas  Procedure(s) Performed: CYSTOSCOPY/bilateral URETEROSCOPY/  right HOLMIUM LASER/  right STENT PLACEMENT/ bilateral Retrograde pylogram (Right: Ureter)  Patient Location: PACU  Anesthesia Type:General  Level of Consciousness: drowsy  Airway & Oxygen Therapy: Patient Spontanous Breathing and Patient connected to face mask oxygen  Post-op Assessment: Report given to RN and Post -op Vital signs reviewed and stable  Post vital signs: Reviewed and stable  Last Vitals:  Vitals Value Taken Time  BP 162/70 07/18/23 1835  Temp 36.9 C 07/18/23 1835  Pulse 71 07/18/23 1837  Resp 13 07/18/23 1837  SpO2 99 % 07/18/23 1837  Vitals shown include unfiled device data.  Last Pain:  Vitals:   07/18/23 1300  TempSrc: Oral  PainSc:       Patients Stated Pain Goal: 1 (07/18/23 0158)  Complications: No notable events documented.

## 2023-07-18 NOTE — Progress Notes (Signed)
 Pharmacy Antibiotic Note  Warren Rojas is a 83 y.o. male admitted on 07/17/2023 with UTI. Patient noted to have bilateral hydronephrosis, AKI, and a R distal ureteral stone on admission as well. Urology following and planning OR on 4/2 for cytoscopy. Pharmacy has been consulted for Zosyn dosing.  Plan: - Zosyn 3.375g IV x1 to be given pre-op - Start Zosyn 3.375g IV q8hrs following pre-op dose - Monitor renal function, cultures, and overall clinical picture - De-escalate as able  Height: 5\' 10"  (177.8 cm) Weight: 90.8 kg (200 lb 2.8 oz) IBW/kg (Calculated) : 73  Temp (24hrs), Avg:97.8 F (36.6 C), Min:97.5 F (36.4 C), Max:98.2 F (36.8 C)  Recent Labs  Lab 07/17/23 1545 07/17/23 1938 07/17/23 2351 07/18/23 0756  WBC 11.5*  --   --  10.7*  CREATININE 3.31* 3.00* 3.01* 2.82*    Estimated Creatinine Clearance: 23.3 mL/min (A) (by C-G formula based on SCr of 2.82 mg/dL (H)).    Allergies  Allergen Reactions   Innovar [Fentanyl-Droperidol] Anaphylaxis    Aspiration   Cephalexin Other (See Comments)   Cephalosporins Other (See Comments)    "Had Hepatitis from water and was advised to stay away of these medications"   Codeine Nausea And Vomiting    Antimicrobials this admission: 4/2 Zosyn >>   Dose adjustments this admission: N/A  Microbiology results: 4/2 UCx: ip    Thank you for allowing pharmacy to be a part of this patient's care.  Cherylin Mylar, PharmD Clinical Pharmacist  4/2/202510:52 AM

## 2023-07-18 NOTE — Anesthesia Procedure Notes (Signed)
 Procedure Name: LMA Insertion Date/Time: 07/18/2023 5:33 PM  Performed by: Maurene Capes, CRNAPre-anesthesia Checklist: Patient identified, Emergency Drugs available, Suction available and Patient being monitored Patient Re-evaluated:Patient Re-evaluated prior to induction Oxygen Delivery Method: Circle System Utilized Preoxygenation: Pre-oxygenation with 100% oxygen Induction Type: IV induction Ventilation: Mask ventilation without difficulty LMA: LMA inserted LMA Size: 4.0 Number of attempts: 1 Placement Confirmation: positive ETCO2 Tube secured with: Tape Dental Injury: Teeth and Oropharynx as per pre-operative assessment

## 2023-07-18 NOTE — Plan of Care (Signed)

## 2023-07-18 NOTE — Anesthesia Preprocedure Evaluation (Signed)
 Anesthesia Evaluation  Patient identified by MRN, date of birth, ID band Patient awake    Reviewed: Allergy & Precautions, H&P , NPO status , Patient's Chart, lab work & pertinent test results, reviewed documented beta blocker date and time   Airway Mallampati: II  TM Distance: >3 FB Neck ROM: Full    Dental  (+) Dental Advisory Given, Teeth Intact   Pulmonary neg pulmonary ROS, former smoker   Pulmonary exam normal breath sounds clear to auscultation       Cardiovascular Exercise Tolerance: Good hypertension, Pt. on home beta blockers and Pt. on medications + CAD, + CABG and + Peripheral Vascular Disease  negative cardio ROS Normal cardiovascular exam Rhythm:Regular Rate:Normal  Reports that he walks 2 miles regularly without any SOB or chest pain    Neuro/Psych  PSYCHIATRIC DISORDERS  Depression    negative neurological ROS     GI/Hepatic negative GI ROS, Neg liver ROS,,,(+) Hepatitis -, A  Endo/Other  negative endocrine ROS    Renal/GU Renal diseasenegative Renal ROSnephrolithisis  negative genitourinary   Musculoskeletal negative musculoskeletal ROS (+)    Abdominal   Peds negative pediatric ROS (+)  Hematology  (+) Blood dyscrasia, anemia   Anesthesia Other Findings   Reproductive/Obstetrics negative OB ROS                              Anesthesia Physical Anesthesia Plan  ASA: 3  Anesthesia Plan: General   Post-op Pain Management: Ofirmev IV (intra-op)*   Induction: Intravenous  PONV Risk Score and Plan: 3 and Ondansetron, Dexamethasone and Treatment may vary due to age or medical condition  Airway Management Planned: LMA  Additional Equipment:   Intra-op Plan:   Post-operative Plan: Extubation in OR  Informed Consent: I have reviewed the patients History and Physical, chart, labs and discussed the procedure including the risks, benefits and alternatives for the  proposed anesthesia with the patient or authorized representative who has indicated his/her understanding and acceptance.     Dental advisory given  Plan Discussed with: CRNA  Anesthesia Plan Comments:          Anesthesia Quick Evaluation

## 2023-07-18 NOTE — Consult Note (Addendum)
 Urology Consult Note   Requesting Attending Physician:  Glade Lloyd, MD Service Providing Consult: Urology  Consulting Attending: Dr Pete Glatter   Reason for Consult:  bilateral hydronephrosis, AKI, and right distal ureteral stone  HPI: Warren Rojas is seen in consultation for reasons noted above at the request of Glade Lloyd, MD for evaluation of bilateral hydronephrosis, AKI, and right distal ureteral stone.  This is a 83 y.o. male with history of dementia, gallstones, urethral stricture s/p endoscopic repair prior to 2018, BPH and CABG who presents with urinary retention found to have bilateral hydronephrosis on CT scan as well as right obstructing distal ureteral stones and multiple bladder stones.     Patient with dementia so history taken from partner in the room.  Over the past week patient has been having increased urinary leakage worsened when he is lying down.  No fevers, chills or nausea or vomiting.  Also had noticed that his belly was becoming more full.  He then started eating less.  This is what prompted her to bring him in.  Patient is not currently on any blood thinning medication.  He does have a aspirin 325 mg listed in his medicines but is unclear if he takes this.  Per the list that the partner showed me, does not appear that he takes this medication.  He does take dutasteride for BPH.    Past Medical History: Past Medical History:  Diagnosis Date   Bladder outlet obstruction    BPH (benign prostatic hypertrophy)    Colon polyp 2010   Coronary atherosclerosis of native coronary artery    Multivessel, normal LVEF   Depression 1997   Diverticulosis    Essential hypertension, benign    Hepatitis A 1/92   Kidney stone    Mixed hyperlipidemia    Personal history of rectal adenoma 03/23/2009   Skin cancer     Past Surgical History:  Past Surgical History:  Procedure Laterality Date   APPENDECTOMY     COLONOSCOPY  03/2009   tortuous colon   CORONARY  ARTERY BYPASS GRAFT  2004   Dr. Tyrone Sage - LIMA to LAD, RIMA to ramus, left radial to OM, SVG to diagonal, SVG to PDA   CYSTOSCOPY     Deviated nasal septum repair  1963   EUS N/A 12/25/2013   Procedure: UPPER ENDOSCOPIC ULTRASOUND (EUS) LINEAR;  Surgeon: Rachael Fee, MD;  Location: [facility];  Service: Endoscopy;  Laterality: N/A;   Resection of urethral diverticulum  1975   TONSILLECTOMY      Medication: Current Facility-Administered Medications  Medication Dose Route Frequency Provider Last Rate Last Admin   0.9 %  sodium chloride infusion   Intravenous Continuous Eduard Clos, MD 125 mL/hr at 07/18/23 0050 New Bag at 07/18/23 0050   hydrALAZINE (APRESOLINE) injection 10 mg  10 mg Intravenous Q4H PRN Eduard Clos, MD        Allergies: Allergies  Allergen Reactions   Innovar [Fentanyl-Droperidol] Anaphylaxis    Aspiration   Cephalosporins Other (See Comments)    "Had Hepatitis from water and was advised to stay away of these medications"   Codeine Nausea And Vomiting    Social History: Social History   Tobacco Use   Smoking status: Former    Current packs/day: 0.00    Types: Cigarettes    Quit date: 04/18/1963    Years since quitting: 60.2   Smokeless tobacco: Never   Tobacco comments:    smoked 3 packs total  while in [employer]  Substance Use Topics   Alcohol use: No   Drug use: No    Family History Family History  Problem Relation Age of Onset   Coronary artery disease Brother        Premature cardiovascular disease   Heart disease Mother    Hypertension Mother    Hypertension Father    Transient ischemic attack Father    Anuerysm Father        aortic    Pancreatic cancer Sister     Review of Systems 10 systems were reviewed and are negative except as noted specifically in the HPI.  Objective   Vital signs in last 24 hours: BP (!) 117/99 (BP Location: Right Arm)   Pulse 67   Temp 98 F (36.7 C) (Oral)   Resp 14   Wt 90.8 kg    SpO2 97%   BMI 27.92 kg/m   Physical Exam General: NAD, A&O, resting, appropriate HEENT: [place]/AT, EOMI, MMM Pulmonary: Normal work of breathing Cardiovascular: HDS, adequate peripheral perfusion Abdomen: Soft, NTTP,. GU: foley in place with clear yellow urine.  Extremities: warm and well perfused Neuro: Appropriate, no focal neurological deficits  Most Recent Labs: Lab Results  Component Value Date   WBC 11.5 (H) 07/17/2023   HGB 12.0 (L) 07/17/2023   HCT 34.6 (L) 07/17/2023   PLT 372 07/17/2023    Lab Results  Component Value Date   NA 139 07/17/2023   K 4.0 07/17/2023   CL 104 07/17/2023   CO2 24 07/17/2023   BUN 67 (H) 07/17/2023   CREATININE 3.01 (H) 07/17/2023   CALCIUM 8.9 07/17/2023   MG 2.1 07/17/2023    No results found for: "INR", "APTT"   Urine Culture: @[id number] (laburin,org,[id number])@   IMAGING: CT ABDOMEN PELVIS WO CONTRAST Result Date: 07/17/2023 CLINICAL DATA:  Abdominal pain.  Urinary retention. EXAM: CT ABDOMEN AND PELVIS WITHOUT CONTRAST TECHNIQUE: Multidetector CT imaging of the abdomen and pelvis was performed following the standard protocol without IV contrast. RADIATION DOSE REDUCTION: This exam was performed according to the departmental dose-optimization program which includes automated exposure control, adjustment of the mA and/or kV according to patient size and/or use of iterative reconstruction technique. COMPARISON:  CT abdomen pelvis dated 10/07/2013. FINDINGS: Evaluation of this exam is limited in the absence of intravenous contrast. Lower chest: The visualized lung bases are clear. There is coronary vascular calcification and calcification of the mitral annulus. No intra-abdominal free air or free fluid. Hepatobiliary: The liver is unremarkable. No biliary dilatation. Layering small stones in the gallbladder. No pericholecystic fluid or evidence of acute cholecystitis by CT. Pancreas: Unremarkable. No pancreatic ductal dilatation or  surrounding inflammatory changes. Spleen: Normal in size without focal abnormality. Adrenals/Urinary Tract: The adrenal glands are unremarkable. Mild bilateral hydronephrosis, right greater than left. There is mild right hydroureter. No stones noted in the kidneys. The urinary bladder is decompressed around a Foley catheter. Multiple stones noted within the urinary bladder. There is a 6 mm partially obstructing stone in the distal right ureter. Three adjacent stones along the right posterior bladder wall measuring up to 4 mm may be within the bladder lumen or at the ureterovesical junction. There is diffuse thickening of the bladder wall which may be partly related to underdistention. Cystitis is not excluded. Correlation with urinalysis recommended. Stomach/Bowel: There is sigmoid diverticulosis. There is no bowel obstruction or active inflammation. The appendix is not visualized with certainty. No inflammatory changes identified in the right lower quadrant. Vascular/Lymphatic: Moderate aortoiliac  atherosclerotic disease. The IVC is unremarkable. No portal venous gas. There is no adenopathy. Reproductive: The prostate and seminal vesicles are grossly unremarkable. No pelvic mass. Other: None Musculoskeletal: Osteopenia with degenerative changes of the spine. No acute osseous pathology. IMPRESSION: 1. Mild bilateral hydronephrosis, right greater than left. A 6 mm partially obstructing stone in the distal right ureter. Multiple bladder calculi. Three adjacent stones along the right posterior bladder wall measuring up to 4 mm may be within the bladder lumen or at the ureterovesical junction. 2. Sigmoid diverticulosis. No bowel obstruction. 3. Cholelithiasis. Electronically Signed   By: Elgie Collard M.D.   On: 07/17/2023 18:17    ------  Assessment:  83 y.o. male with history of dementia, gallstones, urethral stricture s/p endoscopic repair prior to 2018, BPH and CABG who presents with urinary retention  found to have bilateral hydronephrosis on CT scan as well as right obstructing distal ureteral stones and multiple bladder stones.   On arrival to the ED, patient was hypertensive but otherwise hemodynamically stable.  Bladder scan with greater than a liter.  He has since put out over 3 L in 4 hours since Foley catheter was placed.   Labs show elevated creatinine of 3.31 from a baseline of 0.89 however this was from 2015.  I do not have any recent labs.  Cr went down to 3 after fluids.  White blood cell count mildly elevated to 11.5.  Urinalysis with small hemoglobin, large leukocyte Estrace but negative nitrite.  There are 21-50 WBCs but no bacteria seen.  Less concerning for infection and more likely sterile pyuria.   Given c/f distal obstructing stones, plan for likely right ureteral stent placement with possible right ureteroscopy and stone removal as well as removal of bladder stones.    Recommendations: - continue NPO status -Abx ordered for OR, possible OR today vs tomorrow pending schedule -continue foley catheter for 2 weeks.  -continue to monitor for post obstructive diuresis.  -trend Cr -Recommend starting Flomax if medically able  Plan discussed with Dr. Pete Glatter    Thank you for this consult. Please contact the urology consult pager with any further questions/concerns.

## 2023-07-18 NOTE — Interval H&P Note (Signed)
 History and Physical Interval Note:  07/18/2023 5:15 PM  Warren Rojas  has presented today for surgery, with the diagnosis of RIGHT URETERAL STONE.  The various methods of treatment have been discussed with the patient and family. After consideration of risks, benefits and other options for treatment, the patient has consented to  Procedure(s): CYSTOSCOPY/URETEROSCOPY/HOLMIUM LASER/STENT PLACEMENT (Right) as a surgical intervention.  The patient's history has been reviewed, patient examined, no change in status, stable for surgery.  I have reviewed the patient's chart and labs.  Questions were answered to the patient's satisfaction.     [name]

## 2023-07-18 NOTE — Progress Notes (Signed)
 PT Cancellation Note  Patient Details Name: Warren Rojas MRN: [id number] DOB: 1940/10/26   Cancelled Treatment:    Reason Eval/Treat Not Completed: Patient at procedure or test/unavailable (pt out of room for a procedure. Will follow.)   Tamala Ser PT 07/18/2023  Acute Rehabilitation Services  Office [phone]

## 2023-07-18 NOTE — Op Note (Signed)
 OPERATIVE NOTE   Patient Name: Warren Rojas  MRN: [id number]   Date of Procedure: 07/18/23   Preoperative diagnosis:  Right ureteral calculus Bladder calculi Bilateral hydronephrosis Urinary retention AKI  Postoperative diagnosis:  Right ureteral calculus Bladder calculi Bilateral hydronephrosis Urinary retention AKI  Procedure:  Cystoscopy Bilateral retrograde pyelograms Cystolitholopaxy Right ureteroscopic laser lithotripsy Left ureteroscopy Insertion of right ureteral stent (6 F x 26 cm, no tether)  Attending: Milderd Meager, MD  Anesthesia: General  Estimated blood loss: 5 mL  Fluids: Per anesthesia record  Drains: 61F x 26 cm right ureteral stent, tether removed; 18 [place] coud catheter  Specimens: Ureteral calculi and bladder calculi  Antibiotics: Zosyn 3.375 g IV  Findings:  -No urethral stricture noted -Bilobar enlargement of the prostate with elevation of the bladder neck -Large capacity bladder with trabeculations and cellules -Multiple bladder calculi -Right retrograde pyelogram with 6 mm calculus in the distal ureter with proximal hydronephrosis extending to the renal pelvis and calyces -Left retrograde pyelogram with dilation from UVJ to renal pelvis without filling defect  Indications:  83 year old male presented to the ER last night with decreased p.o. intake and abdominal pain.  Evaluation in the emergency room demonstrated AKI with a creatinine of 3.31.  A Foley catheter was placed with return of 3 L of urine.  CT imaging showed bilateral hydronephrosis, 6 mm distal right ureteral calculus, and multiple bladder calculi.  His renal function has improved slightly with bladder drainage.  He presents now for surgical management with cystoscopy, bilateral retrograde pyelograms, cystolitholopaxy, possible right ureteroscopy with laser lithotripsy and insertion of right ureteral stent.  The procedure including potential risk was discussed with  the patient and his sons in detail.  They understand and wish to proceed as described.  Description of Procedure:  The patient received IV Zosyn preoperatively.  He was taken to the operating room suite and properly identified.  After successful induction of a general anesthetic, he was placed in the dorsolithotomy position.  The Foley catheter was removed.  The patient's genital area was prepped and draped in sterile fashion.  A preoperative timeout was performed. Under direct visualization, a 21 French rigid cystoscope was passed through the urethra into the bladder.  There was no evidence of any urethral stricture.  Bilateral enlargement of the prostate was noted with elevation of the bladder neck.  Upon entering the bladder multiple bladder calculi were identified.  The bladder was very large in capacity with multiple trabeculations and cellules.  No mucosal lesions were identified.  A normal-appearing trigone was seen with a single ureteral orifice bilaterally.  The majority of the stones were irrigated out of the bladder through the cystoscope. A 6 French open-ended catheter was passed into the right ureter with the assistance of a sensor guidewire.  There was some J hooking noted of the distal right ureter.  Injection of contrast through the open-ended catheter demonstrated dilation of the ureter throughout its entirety and extending into the renal pelvis and calyces.  A 6 mm calcification was noted in the distal right ureter consistent with the stone seen on CT imaging.  A sensor guidewire was then passed into the right renal pelvis under fluoroscopic guidance.  Ureteroscopy was performed alongside the guidewire.  The ureteral calculus was identified in the distal ureter.  Using the holmium laser fiber, the stone was fragmented into several smaller fragments which were removed using a nitinol basket.  Inspection of the ureter demonstrated no injury.  No additional stones  were seen.  A 6 French by 26 cm  double-J stent was then passed over the guidewire and into the right renal pelvis.  Position was confirmed with fluoroscopy.  The tether was removed prior to stent placement. I attempted to pass a ureteral catheter into the left ureter for a retrograde pyelogram.  I had some difficulty due to the J hooking of the left ureter.  I passed the ureteroscope into the distal ureter and injected contrast into the ureter.  There was dilation of the ureter throughout its entirety extending up into the left kidney.  No filling defect was seen.  I elected not to place a stent as the hydronephrosis was likely secondary to his bladder outlet obstruction rather than a ureteral obstruction. The remaining 2 bladder stones were removed through the cystoscope using the stone basket.  Inspection of the bladder showed no remaining stones.  No stones were seen under fluoroscopy.  The stent remained in good position.  An 56 French coud catheter was then placed with 10 mL of sterile water in the balloon.  There was return of clear irrigant.  Rectal exam demonstrated a 40 g gland without nodularity.  The patient was then extubated and taken to the postanesthesia care in stable condition.  Complications: None  Condition: Stable, extubated, transferred to PACU  Plan:  Continue Foley catheter for 10-14 days due to the significant distention of the bladder. Continue stent with planned removal in the office. Voiding trial as an outpatient. Begin tamsulosin 0.4 mg daily Continue Avodart.

## 2023-07-19 ENCOUNTER — Encounter (HOSPITAL_COMMUNITY): Payer: Self-pay | Admitting: Urology

## 2023-07-19 DIAGNOSIS — N179 Acute kidney failure, unspecified: Secondary | ICD-10-CM | POA: Diagnosis not present

## 2023-07-19 LAB — CBC WITH DIFFERENTIAL/PLATELET
Abs Immature Granulocytes: 0.12 10*3/uL — ABNORMAL HIGH (ref 0.00–0.07)
Basophils Absolute: 0 10*3/uL (ref 0.0–0.1)
Basophils Relative: 0 %
Eosinophils Absolute: 0 10*3/uL (ref 0.0–0.5)
Eosinophils Relative: 0 %
HCT: 38.5 % — ABNORMAL LOW (ref 39.0–52.0)
Hemoglobin: 12.9 g/dL — ABNORMAL LOW (ref 13.0–17.0)
Immature Granulocytes: 1 %
Lymphocytes Relative: 7 %
Lymphs Abs: 0.8 10*3/uL (ref 0.7–4.0)
MCH: 31.6 pg (ref 26.0–34.0)
MCHC: 33.5 g/dL (ref 30.0–36.0)
MCV: 94.4 fL (ref 80.0–100.0)
Monocytes Absolute: 0.5 10*3/uL (ref 0.1–1.0)
Monocytes Relative: 4 %
Neutro Abs: 10.2 10*3/uL — ABNORMAL HIGH (ref 1.7–7.7)
Neutrophils Relative %: 88 %
Platelets: 355 10*3/uL (ref 150–400)
RBC: 4.08 MIL/uL — ABNORMAL LOW (ref 4.22–5.81)
RDW: 11.6 % (ref 11.5–15.5)
WBC: 11.6 10*3/uL — ABNORMAL HIGH (ref 4.0–10.5)
nRBC: 0 % (ref 0.0–0.2)

## 2023-07-19 LAB — BASIC METABOLIC PANEL WITH GFR
Anion gap: 11 (ref 5–15)
BUN: 52 mg/dL — ABNORMAL HIGH (ref 8–23)
CO2: 22 mmol/L (ref 22–32)
Calcium: 8.8 mg/dL — ABNORMAL LOW (ref 8.9–10.3)
Chloride: 111 mmol/L (ref 98–111)
Creatinine, Ser: 2.52 mg/dL — ABNORMAL HIGH (ref 0.61–1.24)
GFR, Estimated: 25 mL/min — ABNORMAL LOW (ref >60)
Glucose, Bld: 139 mg/dL — ABNORMAL HIGH (ref 70–99)
Potassium: 5 mmol/L (ref 3.5–5.1)
Sodium: 144 mmol/L (ref 135–145)

## 2023-07-19 LAB — URINE CULTURE: Culture: NO GROWTH

## 2023-07-19 LAB — MAGNESIUM: Magnesium: 1.9 mg/dL (ref 1.7–2.4)

## 2023-07-19 MED ORDER — CITALOPRAM HYDROBROMIDE 20 MG PO TABS
20.0000 mg | ORAL_TABLET | Freq: Every day | ORAL | Status: DC
  Administered 2023-07-19 – 2023-07-20 (×2): 20 mg via ORAL
  Filled 2023-07-19 (×2): qty 1

## 2023-07-19 MED ORDER — DUTASTERIDE 0.5 MG PO CAPS
0.5000 mg | ORAL_CAPSULE | Freq: Every day | ORAL | Status: DC
  Administered 2023-07-19 – 2023-07-20 (×2): 0.5 mg via ORAL
  Filled 2023-07-19 (×2): qty 1

## 2023-07-19 MED ORDER — MEMANTINE HCL 10 MG PO TABS
5.0000 mg | ORAL_TABLET | Freq: Two times a day (BID) | ORAL | Status: DC
  Administered 2023-07-19 – 2023-07-20 (×3): 5 mg via ORAL
  Filled 2023-07-19 (×3): qty 1

## 2023-07-19 MED ORDER — SIMVASTATIN 20 MG PO TABS
20.0000 mg | ORAL_TABLET | Freq: Every day | ORAL | Status: DC
  Administered 2023-07-19: 20 mg via ORAL
  Filled 2023-07-19: qty 1

## 2023-07-19 NOTE — Progress Notes (Signed)
 1 Day Post-Op Subjective: POD1 from cystourethroscopy, b/l ureteroscopy with right ureteral stone removal and right stent placement without dangle.   Pt did well overnight. VSS. Cr down to 2.5 form 2.8 yesterday.   Objective: Vital signs in last 24 hours: Temp:  [97.4 F (36.3 C)-98.8 F (37.1 C)] 97.4 F (36.3 C) (04/03 0641) Pulse Rate:  [61-81] 61 (04/03 0641) Resp:  [11-18] 18 (04/03 0641) BP: (132-180)/(60-147) 146/60 (04/03 0641) SpO2:  [95 %-100 %] 97 % (04/03 0641)  Assessment/Plan: #Bilateral hydronephrosis 2/2 to outlet obstruction -creatinine continues to improve. Continue to monitor.  -continue foley catheter for 2 weeks. Pt to have voiding trial as outpatient.  -continue flomax  -continue avodart.  -follow up ucx. If positive treat for total course of 14 days based on sensitivities. Marland Kitchen   #s/p right ureteral stone removal -continue stent. Will remove in office.  - continue flomax to help with stent discomfort.    Intake/Output from previous day: 04/02 0701 - 04/03 0700 In: 863.7 [I.V.:813.7; IV Piggyback:50] Out: 1150 [Urine:1150]  Intake/Output this shift: No intake/output data recorded.  Physical Exam:  General: Alert and oriented CV: No cyanosis Lungs: equal chest rise Abdomen: Soft, NTND, no rebound or guarding Gu: foley in place with clear urine with staining in tubing.   Lab Results: Recent Labs    07/17/23 1545 07/18/23 0756 07/19/23 0611  HGB 12.0* 13.0 12.9*  HCT 34.6* 38.5* 38.5*   BMET Recent Labs    07/18/23 0756 07/19/23 0611  NA 141 144  K 3.7 5.0  CL 108 111  CO2 24 22  GLUCOSE 107* 139*  BUN 60* 52*  CREATININE 2.82* 2.52*  CALCIUM 8.7* 8.8*     Studies/Results: DG C-Arm 1-60 Min-No Report Result Date: 07/18/2023 Fluoroscopy was utilized by the requesting physician.  No radiographic interpretation.   CT ABDOMEN PELVIS WO CONTRAST Result Date: 07/17/2023 CLINICAL DATA:  Abdominal pain.  Urinary retention. EXAM: CT  ABDOMEN AND PELVIS WITHOUT CONTRAST TECHNIQUE: Multidetector CT imaging of the abdomen and pelvis was performed following the standard protocol without IV contrast. RADIATION DOSE REDUCTION: This exam was performed according to the departmental dose-optimization program which includes automated exposure control, adjustment of the mA and/or kV according to patient size and/or use of iterative reconstruction technique. COMPARISON:  CT abdomen pelvis dated 10/07/2013. FINDINGS: Evaluation of this exam is limited in the absence of intravenous contrast. Lower chest: The visualized lung bases are clear. There is coronary vascular calcification and calcification of the mitral annulus. No intra-abdominal free air or free fluid. Hepatobiliary: The liver is unremarkable. No biliary dilatation. Layering small stones in the gallbladder. No pericholecystic fluid or evidence of acute cholecystitis by CT. Pancreas: Unremarkable. No pancreatic ductal dilatation or surrounding inflammatory changes. Spleen: Normal in size without focal abnormality. Adrenals/Urinary Tract: The adrenal glands are unremarkable. Mild bilateral hydronephrosis, right greater than left. There is mild right hydroureter. No stones noted in the kidneys. The urinary bladder is decompressed around a Foley catheter. Multiple stones noted within the urinary bladder. There is a 6 mm partially obstructing stone in the distal right ureter. Three adjacent stones along the right posterior bladder wall measuring up to 4 mm may be within the bladder lumen or at the ureterovesical junction. There is diffuse thickening of the bladder wall which may be partly related to underdistention. Cystitis is not excluded. Correlation with urinalysis recommended. Stomach/Bowel: There is sigmoid diverticulosis. There is no bowel obstruction or active inflammation. The appendix is not visualized with  certainty. No inflammatory changes identified in the right lower quadrant.  Vascular/Lymphatic: Moderate aortoiliac atherosclerotic disease. The IVC is unremarkable. No portal venous gas. There is no adenopathy. Reproductive: The prostate and seminal vesicles are grossly unremarkable. No pelvic mass. Other: None Musculoskeletal: Osteopenia with degenerative changes of the spine. No acute osseous pathology. IMPRESSION: 1. Mild bilateral hydronephrosis, right greater than left. A 6 mm partially obstructing stone in the distal right ureter. Multiple bladder calculi. Three adjacent stones along the right posterior bladder wall measuring up to 4 mm may be within the bladder lumen or at the ureterovesical junction. 2. Sigmoid diverticulosis. No bowel obstruction. 3. Cholelithiasis. Electronically Signed   By: Elgie Collard M.D.   On: 07/17/2023 18:17      LOS: 2 days   Jerald Kief, MD, PhD [facility] Resident  [id number] [facility]    07/19/2023, 8:16 AM

## 2023-07-19 NOTE — Evaluation (Signed)
 Physical Therapy Evaluation Patient Details Name: Warren Rojas MRN: [id number] DOB: 1941/02/24 Today's Date: 07/19/2023  History of Present Illness  83 y.o. male was brought to the ER after patient was complaining of abdominal discomfort mostly in the lower abdomen for the last few days. Dx of ARF with obstructive uropathy 2* stone in R ureter. s/p cystoscopy, lithotripsy, R ureteral stent placement 07/18/23.  Pt with history of CAD status post CABG, prior history of ureteral stricture status post endoscopy repair, hypertension.  Clinical Impression  Pt admitted with above diagnosis. Pt ambulated 70' with RW with min assist for balance as pt was unsteady. Pt is oriented to self only, not able to state current location nor month/year. Pt's partner [name] was present and stated pt's memory has declined significantly recently. At baseline he ambulates independently without an assistive device. RW recommended 2* unsteadiness with walking today.  Pt currently with functional limitations due to the deficits listed below (see PT Problem List). Pt will benefit from acute skilled PT to increase their independence and safety with mobility to allow discharge.           If plan is discharge home, recommend the following: A little help with walking and/or transfers;A little help with bathing/dressing/bathroom;Assistance with cooking/housework;Assist for transportation;Help with stairs or ramp for entrance;Direct supervision/assist for financial management;Direct supervision/assist for medications management   Can travel by private vehicle        Equipment Recommendations Rolling walker (2 wheels)  Recommendations for Other Services       Functional Status Assessment Patient has had a recent decline in their functional status and demonstrates the ability to make significant improvements in function in a reasonable and predictable amount of time.     Precautions / Restrictions Precautions Precautions:  Fall Recall of Precautions/Restrictions: Impaired Precaution/Restrictions Comments: pt's partner reports pt hasn't had falls in past 6 months; he was unsteady on eval Restrictions Weight Bearing Restrictions Per Provider Order: No      Mobility  Bed Mobility Overal bed mobility: Modified Independent             General bed mobility comments: HOB up, used rail    Transfers Overall transfer level: Needs assistance Equipment used: Rolling walker (2 wheels) Transfers: Sit to/from Stand Sit to Stand: Mod assist, From elevated surface           General transfer comment: assist to power up and to steady, VCs hand placement    Ambulation/Gait Ambulation/Gait assistance: Min assist Gait Distance (Feet): 70 Feet Assistive device: Rolling walker (2 wheels) Gait Pattern/deviations: Step-through pattern, Trunk flexed Gait velocity: decr     General Gait Details: mildly unsteady, loss of balance x2 requiring min A to steady, VCs for proximity to [employer] Mobility     Tilt Bed    Modified Rankin (Stroke Patients Only)       Balance Overall balance assessment: Needs assistance Sitting-balance support: No upper extremity supported, Feet unsupported Sitting balance-Leahy Scale: Good     Standing balance support: Bilateral upper extremity supported, Reliant on assistive device for balance, During functional activity Standing balance-Leahy Scale: Poor Standing balance comment: unsteady without BUE support                             Pertinent Vitals/Pain Pain Assessment Pain Assessment: PAINAD Breathing: normal Negative Vocalization: none Facial Expression: smiling or inexpressive Body  Language: relaxed Consolability: no need to console PAINAD Score: 0    Home Living Family/patient expects to be discharged to:: Private residence       Home Access: Stairs to enter   Secretary/administrator of Steps: 1   Home  Layout: One level Home Equipment: None      Prior Function Prior Level of Function : Independent/Modified Independent             Mobility Comments: walks without AD, no falls in past 6 months ADLs Comments: independent     Extremity/Trunk Assessment        Lower Extremity Assessment Lower Extremity Assessment: Overall WFL for tasks assessed    Cervical / Trunk Assessment Cervical / Trunk Assessment: Kyphotic  Communication   Communication Communication: No apparent difficulties    Cognition Arousal: Alert Behavior During Therapy: WFL for tasks assessed/performed   PT - Cognitive impairments: History of cognitive impairments, Orientation, Memory   Orientation impairments: Place, Time, Situation                     Following commands: Intact       Cueing Cueing Techniques: Verbal cues, Tactile cues     General Comments      Exercises     Assessment/Plan    PT Assessment Patient needs continued PT services  PT Problem List Decreased activity tolerance;Decreased mobility;Decreased balance       PT Treatment Interventions Gait training;Therapeutic exercise;Therapeutic activities    PT Goals (Current goals can be found in the Care Plan section)  Acute Rehab PT Goals Patient Stated Goal: likes to be active PT Goal Formulation: With patient/family Time For Goal Achievement: 08/02/23 Potential to Achieve Goals: Good    Frequency Min 3X/week     Co-evaluation               AM-PAC PT "6 Clicks" Mobility  Outcome Measure Help needed turning from your back to your side while in a flat bed without using bedrails?: A Little Help needed moving from lying on your back to sitting on the side of a flat bed without using bedrails?: A Little Help needed moving to and from a bed to a chair (including a wheelchair)?: A Little Help needed standing up from a chair using your arms (e.g., wheelchair or bedside chair)?: A Little Help needed to walk in  hospital room?: A Little Help needed climbing 3-5 steps with a railing? : A Lot 6 Click Score: 17    End of Session Equipment Utilized During Treatment: Gait belt Activity Tolerance: Patient tolerated treatment well Patient left: in chair;with call bell/phone within reach;with family/visitor present Nurse Communication: Mobility status PT Visit Diagnosis: Unsteadiness on feet (R26.81);Difficulty in walking, not elsewhere classified (R26.2)    Time: 8469-6295 PT Time Calculation (min) (ACUTE ONLY): 18 min   Charges:   PT Evaluation $PT Eval Moderate Complexity: 1 Mod   PT General Charges $$ ACUTE PT VISIT: 1 Visit        Tamala Ser PT 07/19/2023  Acute Rehabilitation Services  Office [phone]

## 2023-07-19 NOTE — Plan of Care (Signed)
  Problem: Clinical Measurements: Goal: Ability to maintain clinical measurements within normal limits will improve Outcome: Progressing Goal: Will remain free from infection Outcome: Progressing   Problem: Elimination: Goal: Will not experience complications related to urinary retention Outcome: Progressing   Problem: Pain Managment: Goal: General experience of comfort will improve and/or be controlled Outcome: Progressing   Problem: Safety: Goal: Ability to remain free from injury will improve Outcome: Progressing   Problem: Skin Integrity: Goal: Risk for impaired skin integrity will decrease Outcome: Progressing

## 2023-07-19 NOTE — Progress Notes (Signed)
 PROGRESS NOTE    Warren Rojas  XNA:[id number] DOB: [birth year] DOA: 07/17/2023 PCP: Ignatius Specking, MD   Brief Narrative:  83 y.o. male with history of CAD status post CABG, prior history of ureteral stricture status post endoscopy repair, hypertension presented with abdominal discomfort.  On presentation, creatinine was 3.3. Bladder scan showed fluid retention and also CT scan of the abdomen showed bilateral hydronephrosis with the right sided obstructing distal ureteral stone.  UA showed pyuria and positive leukocyte esterase.  He was started on IV fluids and antibiotics.  Urology was consulted.  He underwent cystoscopy with cystolitholapaxy, right ureteroscopy laser lithotripsy and left ureteroscopy with insertion of right ureteral stent on 07/18/2023 by urology.  Assessment & Plan:   Obstructive uropathy leading to bilateral hydronephrosis with right-sided obstructing distal ureteral stone UTI/acute pyelonephritis: Present on admission AKI -Presented with abdominal pain with creatinine of 3.3 and UA and imaging as above. -underwent cystoscopy with cystolitholapaxy, right ureteroscopy laser lithotripsy and left ureteroscopy with insertion of right ureteral stent on 07/18/2023 by urology -Follow further urology recommendations.  Continue Foley catheter for 10 to 14 days.  Outpatient follow-up with urology for stent removal -Continue tamsulosin.  Resume Avodart -Continue broad-spectrum IV antibiotics.  Follow urine cultures. -Creatinine improving to 2.52 today.  Continue IV fluids.  Monitor creatinine.  Cholelithiasis -No signs of acute cholecystitis.  Outpatient follow-up with surgery if needed  Leukocytosis -Mild.  Monitor  Anemia of chronic disease -From chronic illnesses.  Hemoglobin stable  Hyponatremia -Resolved  Hypertension -Blood pressure on the higher side.  Monitor.  History of CAD status post CABG -Stable.  No chest pain.  Outpatient follow-up with cardiology  Cognitive  impairment/dementia -Monitor mental status.  Outpatient follow-up with PCP  DVT prophylaxis: SCDs Code Status: Full Family Communication: Wife at bedside Disposition Plan: Status is: Inpatient Remains inpatient appropriate because: Of severity of illness    Consultants: Urology  Procedures: As above  Antimicrobials:  Anti-infectives (From admission, onward)    Start     Dose/Rate Route Frequency Ordered Stop   07/19/23 0000  piperacillin-tazobactam (ZOSYN) IVPB 3.375 g        3.375 g 12.5 mL/hr over 240 Minutes Intravenous Every 8 hours 07/18/23 1230     07/18/23 1756  piperacillin-tazobactam (ZOSYN) 3.375 GM/50ML IVPB       Note to Pharmacy: Candee Furbish W: cabinet override      07/18/23 1756 07/19/23 0559   07/18/23 1730  piperacillin-tazobactam (ZOSYN) IVPB 3.375 g        3.375 g 100 mL/hr over 30 Minutes Intravenous To Surgery 07/18/23 1719 07/19/23 1730   07/18/23 0815  piperacillin-tazobactam (ZOSYN) IVPB 3.375 g        3.375 g 100 mL/hr over 30 Minutes Intravenous On call to O.R. 07/18/23 0727 07/18/23 1757        Subjective: Patient seen and examined at bedside.  No fever, vomiting, worsening abdominal pain reported.  Objective: Vitals:   07/18/23 1945 07/18/23 2013 07/19/23 0111 07/19/23 0641  BP: (!) 165/81 (!) 166/80 (!) 151/88 (!) 146/60  Pulse: 70 73 81 61  Resp: 13 18 18 18   Temp:  98.4 F (36.9 C) 98.8 F (37.1 C) (!) 97.4 F (36.3 C)  TempSrc:  Oral Oral Oral  SpO2: 99% 97% 99% 97%  Weight:      Height:        Intake/Output Summary (Last 24 hours) at 07/19/2023 0804 Last data filed at 07/18/2023 2049 Gross per 24 hour  Intake 863.67 ml  Output 1150 ml  Net -286.33 ml   Filed Weights   07/17/23 2152  Weight: 90.8 kg    Examination:  General exam: Appears calm and comfortable  Respiratory system: Bilateral decreased breath sounds at bases Cardiovascular system: S1 & S2 heard, Rate controlled Gastrointestinal system: Abdomen is  nondistended, soft and nontender. Normal bowel sounds heard. Extremities: No cyanosis, clubbing, edema  Central nervous system: Sleepy, wakes up slightly, slow to respond.  Poor historian.  No focal neurological deficits. Moving extremities Skin: No rashes, lesions or ulcers Psychiatry: Flat affect.  Not agitated. Genitourinary: Foley catheter present    Data Reviewed: I have personally reviewed following labs and imaging studies  CBC: Recent Labs  Lab 07/17/23 1545 07/18/23 0756 07/19/23 0611  WBC 11.5* 10.7* 11.6*  NEUTROABS 8.4* 8.0* 10.2*  HGB 12.0* 13.0 12.9*  HCT 34.6* 38.5* 38.5*  MCV 90.8 94.6 94.4  PLT 372 342 355   Basic Metabolic Panel: Recent Labs  Lab 07/17/23 1545 07/17/23 1938 07/17/23 2351 07/18/23 0756 07/19/23 0611  NA 134* 137 139 141 144  K 4.3 3.9 4.0 3.7 5.0  CL 98 101 104 108 111  CO2 24 26 24 24 22   GLUCOSE 109* 110* 109* 107* 139*  BUN 72* 67* 67* 60* 52*  CREATININE 3.31* 3.00* 3.01* 2.82* 2.52*  CALCIUM 9.0 9.0 8.9 8.7* 8.8*  MG  --  2.1  --   --  1.9   GFR: Estimated Creatinine Clearance: 26 mL/min (A) (by C-G formula based on SCr of 2.52 mg/dL (H)). Liver Function Tests: Recent Labs  Lab 07/17/23 1545 07/18/23 0756  AST 14* 16  ALT 11 12  ALKPHOS 67 69  BILITOT 0.6 1.0  PROT 7.3 6.8  ALBUMIN 4.2 3.5   Recent Labs  Lab 07/17/23 1545  LIPASE 166*   No results for input(s): "AMMONIA" in the last 168 hours. Coagulation Profile: No results for input(s): "INR", "PROTIME" in the last 168 hours. Cardiac Enzymes: No results for input(s): "CKTOTAL", "CKMB", "CKMBINDEX", "TROPONINI" in the last 168 hours. BNP (last 3 results) No results for input(s): "PROBNP" in the last 8760 hours. HbA1C: No results for input(s): "HGBA1C" in the last 72 hours. CBG: No results for input(s): "GLUCAP" in the last 168 hours. Lipid Profile: No results for input(s): "CHOL", "HDL", "LDLCALC", "TRIG", "CHOLHDL", "LDLDIRECT" in the last 72  hours. Thyroid Function Tests: No results for input(s): "TSH", "T4TOTAL", "FREET4", "T3FREE", "THYROIDAB" in the last 72 hours. Anemia Panel: Recent Labs    07/18/23 0756  VITAMINB12 341  FOLATE 6.4  FERRITIN 527*  TIBC 269  IRON 69  RETICCTPCT 1.2   Sepsis Labs: No results for input(s): "PROCALCITON", "LATICACIDVEN" in the last 168 hours.  No results found for this or any previous visit (from the past 240 hours).       Radiology Studies: DG C-Arm 1-60 Min-No Report Result Date: 07/18/2023 Fluoroscopy was utilized by the requesting physician.  No radiographic interpretation.   CT ABDOMEN PELVIS WO CONTRAST Result Date: 07/17/2023 CLINICAL DATA:  Abdominal pain.  Urinary retention. EXAM: CT ABDOMEN AND PELVIS WITHOUT CONTRAST TECHNIQUE: Multidetector CT imaging of the abdomen and pelvis was performed following the standard protocol without IV contrast. RADIATION DOSE REDUCTION: This exam was performed according to the departmental dose-optimization program which includes automated exposure control, adjustment of the mA and/or kV according to patient size and/or use of iterative reconstruction technique. COMPARISON:  CT abdomen pelvis dated 10/07/2013. FINDINGS: Evaluation of this exam is  limited in the absence of intravenous contrast. Lower chest: The visualized lung bases are clear. There is coronary vascular calcification and calcification of the mitral annulus. No intra-abdominal free air or free fluid. Hepatobiliary: The liver is unremarkable. No biliary dilatation. Layering small stones in the gallbladder. No pericholecystic fluid or evidence of acute cholecystitis by CT. Pancreas: Unremarkable. No pancreatic ductal dilatation or surrounding inflammatory changes. Spleen: Normal in size without focal abnormality. Adrenals/Urinary Tract: The adrenal glands are unremarkable. Mild bilateral hydronephrosis, right greater than left. There is mild right hydroureter. No stones noted in the  kidneys. The urinary bladder is decompressed around a Foley catheter. Multiple stones noted within the urinary bladder. There is a 6 mm partially obstructing stone in the distal right ureter. Three adjacent stones along the right posterior bladder wall measuring up to 4 mm may be within the bladder lumen or at the ureterovesical junction. There is diffuse thickening of the bladder wall which may be partly related to underdistention. Cystitis is not excluded. Correlation with urinalysis recommended. Stomach/Bowel: There is sigmoid diverticulosis. There is no bowel obstruction or active inflammation. The appendix is not visualized with certainty. No inflammatory changes identified in the right lower quadrant. Vascular/Lymphatic: Moderate aortoiliac atherosclerotic disease. The IVC is unremarkable. No portal venous gas. There is no adenopathy. Reproductive: The prostate and seminal vesicles are grossly unremarkable. No pelvic mass. Other: None Musculoskeletal: Osteopenia with degenerative changes of the spine. No acute osseous pathology. IMPRESSION: 1. Mild bilateral hydronephrosis, right greater than left. A 6 mm partially obstructing stone in the distal right ureter. Multiple bladder calculi. Three adjacent stones along the right posterior bladder wall measuring up to 4 mm may be within the bladder lumen or at the ureterovesical junction. 2. Sigmoid diverticulosis. No bowel obstruction. 3. Cholelithiasis. Electronically Signed   By: Elgie Collard M.D.   On: 07/17/2023 18:17        Scheduled Meds:  Chlorhexidine Gluconate Cloth  6 each Topical Daily   tamsulosin  0.4 mg Oral QPC supper   Continuous Infusions:  sodium chloride 125 mL/hr at 07/19/23 0003   piperacillin-tazobactam (ZOSYN)  IV 3.375 g (07/19/23 0005)   piperacillin-tazobactam            Glade Lloyd, MD [facility] 07/19/2023, 8:04 AM

## 2023-07-20 DIAGNOSIS — N179 Acute kidney failure, unspecified: Secondary | ICD-10-CM | POA: Diagnosis not present

## 2023-07-20 LAB — BASIC METABOLIC PANEL WITH GFR
Anion gap: 9 (ref 5–15)
BUN: 38 mg/dL — ABNORMAL HIGH (ref 8–23)
CO2: 25 mmol/L (ref 22–32)
Calcium: 8.4 mg/dL — ABNORMAL LOW (ref 8.9–10.3)
Chloride: 109 mmol/L (ref 98–111)
Creatinine, Ser: 2.21 mg/dL — ABNORMAL HIGH (ref 0.61–1.24)
GFR, Estimated: 29 mL/min — ABNORMAL LOW (ref >60)
Glucose, Bld: 116 mg/dL — ABNORMAL HIGH (ref 70–99)
Potassium: 3.7 mmol/L (ref 3.5–5.1)
Sodium: 143 mmol/L (ref 135–145)

## 2023-07-20 LAB — CBC WITH DIFFERENTIAL/PLATELET
Abs Immature Granulocytes: 0.12 10*3/uL — ABNORMAL HIGH (ref 0.00–0.07)
Basophils Absolute: 0.1 10*3/uL (ref 0.0–0.1)
Basophils Relative: 1 %
Eosinophils Absolute: 0.3 10*3/uL (ref 0.0–0.5)
Eosinophils Relative: 2 %
HCT: 36.2 % — ABNORMAL LOW (ref 39.0–52.0)
Hemoglobin: 12.2 g/dL — ABNORMAL LOW (ref 13.0–17.0)
Immature Granulocytes: 1 %
Lymphocytes Relative: 15 %
Lymphs Abs: 1.7 10*3/uL (ref 0.7–4.0)
MCH: 32 pg (ref 26.0–34.0)
MCHC: 33.7 g/dL (ref 30.0–36.0)
MCV: 95 fL (ref 80.0–100.0)
Monocytes Absolute: 1 10*3/uL (ref 0.1–1.0)
Monocytes Relative: 9 %
Neutro Abs: 8.6 10*3/uL — ABNORMAL HIGH (ref 1.7–7.7)
Neutrophils Relative %: 72 %
Platelets: 304 10*3/uL (ref 150–400)
RBC: 3.81 MIL/uL — ABNORMAL LOW (ref 4.22–5.81)
RDW: 11.8 % (ref 11.5–15.5)
WBC: 11.8 10*3/uL — ABNORMAL HIGH (ref 4.0–10.5)
nRBC: 0 % (ref 0.0–0.2)

## 2023-07-20 LAB — MAGNESIUM: Magnesium: 1.8 mg/dL (ref 1.7–2.4)

## 2023-07-20 MED ORDER — TAMSULOSIN HCL 0.4 MG PO CAPS: 0.4000 mg | ORAL_CAPSULE | Freq: Every day | ORAL | 0 refills | Status: DC

## 2023-07-20 NOTE — Progress Notes (Signed)
 Family at bedside educated on foley catheter care including cleaning,emptying bag, and how to wear it with clothing. Family verbalized a full understanding of teaching.

## 2023-07-20 NOTE — Progress Notes (Signed)
   07/20/23 1219  TOC Brief Assessment  Insurance and Status Reviewed  Patient has primary care physician Yes (Vyas, Dhruv B, MD)  Home environment has been reviewed Home alone  Prior level of function: independent  Prior/Current Home Services No current home services  Social Drivers of Health Review SDOH reviewed no interventions necessary  Readmission risk has been reviewed Yes  Transition of care needs no transition of care needs at this time

## 2023-07-20 NOTE — Plan of Care (Signed)
  Problem: Education: Goal: Knowledge of General Education information will improve Description: Including pain rating scale, medication(s)/side effects and non-pharmacologic comfort measures Outcome: Progressing   Problem: Health Behavior/Discharge Planning: Goal: Ability to manage health-related needs will improve Outcome: Progressing   Problem: Activity: Goal: Risk for activity intolerance will decrease Outcome: Progressing   Problem: Elimination: Goal: Will not experience complications related to urinary retention Outcome: Progressing   Problem: Pain Managment: Goal: General experience of comfort will improve and/or be controlled Outcome: Progressing   Problem: Safety: Goal: Ability to remain free from injury will improve Outcome: Progressing   Problem: Skin Integrity: Goal: Risk for impaired skin integrity will decrease Outcome: Progressing

## 2023-07-20 NOTE — Anesthesia Postprocedure Evaluation (Signed)
 Anesthesia Post Note  Patient: Warren Rojas  Procedure(s) Performed: CYSTOSCOPY/bilateral URETEROSCOPY/  right HOLMIUM LASER/  right STENT PLACEMENT/ bilateral Retrograde pylogram (Right: Ureter)     Patient location during evaluation: PACU Anesthesia Type: General Level of consciousness: awake and alert (Appears to be at baseline cognitive status) Pain management: pain level controlled Vital Signs Assessment: post-procedure vital signs reviewed and stable Respiratory status: spontaneous breathing, nonlabored ventilation, respiratory function stable and patient connected to nasal cannula oxygen Cardiovascular status: blood pressure returned to baseline and stable Postop Assessment: no apparent nausea or vomiting Anesthetic complications: no   No notable events documented.  Last Vitals:  Vitals:   07/19/23 2138 07/20/23 0643  BP: (!) 149/70 (!) 153/89  Pulse: 70 72  Resp: 20 20  Temp: (!) 36.3 C 37 C  SpO2: 100% 98%    Last Pain:  Vitals:   07/20/23 0643  TempSrc: Oral  PainSc:                  Mariann Barter

## 2023-07-20 NOTE — TOC Transition Note (Signed)
 Transition of Care [facility]) - Discharge Note   Patient Details  Name: Warren Rojas MRN: [id number] Date of Birth: 1941/04/17  [facility] CM/SW Contact:  Beckie Busing, RN Phone Number:[phone]  07/20/2023, 12:27 PM   Clinical Narrative:    Patient with discharge orders. HH has been set up, DME rolling walker has been delivered. No other TOC needs noted. TOC will sign off.    Final next level of care: Home w Home Health Services Barriers to Discharge: No Barriers Identified   Patient Goals and CMS Choice Patient states their goals for this hospitalization and ongoing recovery are:: Ready to go home CMS Medicare.gov Compare Post Acute Care list provided to:: Patient Choice offered to / list presented to : Patient [place] ownership interest in [facility].provided to::  (n/a)    Discharge Placement                       Discharge Plan and Services Additional resources added to the After Visit Summary for   In-house Referral: NA Discharge Planning Services: CM Consult Post Acute Care Choice: Home Health, Durable Medical Equipment          DME Arranged: Dan Humphreys DME Agency: [employer] Date DME Agency Contacted: 07/20/23 Time DME Agency Contacted: 1223 Representative spoke with at DME Agency: Vaughan Basta HH Arranged: PT HH Agency: [facility] Date [facility] Agency Contacted: 07/20/23 Time HH Agency Contacted: 1224 Representative spoke with at [facility] Agency: Elnita Maxwell  Social Drivers of Health (SDOH) Interventions SDOH Screenings   Food Insecurity: No Food Insecurity (07/18/2023)  Housing: Low Risk  (07/18/2023)  Transportation Needs: No Transportation Needs (07/18/2023)  Utilities: Not At Risk (07/18/2023)  Social Connections: Unknown (07/18/2023)  Tobacco Use: Medium Risk (07/18/2023)     Readmission Risk Interventions     No data to display

## 2023-07-20 NOTE — TOC Initial Note (Signed)
 Transition of Care [facility]) - Initial/Assessment Note    Patient Details  Name: Warren Rojas MRN: [id number] Date of Birth: [birth year]  Transition of Care [facility]) CM/SW Contact:    Beckie Busing, RN Phone Number:[phone]  07/20/2023, 12:26 PM  Clinical Narrative:                 [facility] consulted for [name]. CM at bedside to offer choice. Medicare.gov list provided. HH PT set up with [facility] per patient request.   Expected Discharge Plan: Home w Home Health Services Barriers to Discharge: No Barriers Identified   Patient Goals and CMS Choice Patient states their goals for this hospitalization and ongoing recovery are:: Ready to go home CMS Medicare.gov Compare Post Acute Care list provided to:: Patient Choice offered to / list presented to : Patient [name] ownership interest in [facility].provided to::  (n/a)    Expected Discharge Plan and Services In-house Referral: NA Discharge Planning Services: CM Consult Post Acute Care Choice: Home Health, Durable Medical Equipment Living arrangements for the past 2 months: Single Family Home Expected Discharge Date: 07/20/23               DME Arranged: Dan Humphreys DME Agency: [employer] Date DME Agency Contacted: 07/20/23 Time DME Agency Contacted: 1223 Representative spoke with at DME Agency: Vaughan Basta HH Arranged: PT HH Agency: [facility] Date [facility] Agency Contacted: 07/20/23 Time HH Agency Contacted: 1224 Representative spoke with at [facility] Agency: Elnita Maxwell  Prior Living Arrangements/Services Living arrangements for the past 2 months: Single Family Home Lives with:: Self Patient language and need for interpreter reviewed:: Yes Do you feel safe going back to the place where you live?: Yes      Need for Family Participation in Patient Care: Yes (Comment) Care giver support system in place?: Yes (comment) Current home services:  (n/a) Criminal Activity/Legal Involvement Pertinent to Current  Situation/Hospitalization: No - Comment as needed  Activities of Daily Living   ADL Screening (condition at time of admission) Independently performs ADLs?: Yes (appropriate for developmental age) Is the patient deaf or have difficulty hearing?: No Does the patient have difficulty seeing, even when wearing glasses/contacts?: No Does the patient have difficulty concentrating, remembering, or making decisions?: Yes  Permission Sought/Granted Permission sought to share information with : Family Supports Permission granted to share information with : Yes, Verbal Permission Granted  Share Information with NAME: Warren Rojas     Permission granted to share info w Relationship: son  Permission granted to share info w Contact Information: [phone]  Emotional Assessment Appearance:: Appears stated age Attitude/Demeanor/Rapport: Gracious Affect (typically observed): Pleasant Orientation: : Oriented to Self, Oriented to Place, Oriented to  Time, Oriented to Situation Alcohol / Substance Use: Not Applicable Psych Involvement: No (comment)  Admission diagnosis:  Kidney stone [N20.0] Bladder stone [N21.0] Acute unilateral obstructive uropathy [N13.9] Bladder outlet obstruction [N32.0] ARF (acute renal failure) (HCC) [N17.9] Patient Active Problem List   Diagnosis Date Noted   Anemia 07/18/2023   ARF (acute renal failure) (HCC) 07/18/2023   Hyponatremia 07/18/2023   Gallstones 07/18/2023   Elevated lipase 07/18/2023   Acute unilateral obstructive uropathy 07/17/2023   Acute pancreatitis 12/15/2013   Abnormal transaminases-transient in the setting of epigastric pain 12/15/2013   Carotid artery disease (HCC) 07/15/2010   Coronary atherosclerosis of native coronary artery 07/14/2010   Essential hypertension, benign 07/14/2010   Mixed hyperlipidemia 07/14/2010   History of colonic polyps 03/23/2009   PCP:  Doreen Beam  B, MD Pharmacy:   [facility] [phone] - [place], [place] - [address] [place] [address] Phone: [phone] Fax: [phone]     Social Drivers of Health (SDOH) Social History: SDOH Screenings   Food Insecurity: No Food Insecurity (07/18/2023)  Housing: Low Risk  (07/18/2023)  Transportation Needs: No Transportation Needs (07/18/2023)  Utilities: Not At Risk (07/18/2023)  Social Connections: Unknown (07/18/2023)  Tobacco Use: Medium Risk (07/18/2023)   SDOH Interventions:     Readmission Risk Interventions     No data to display

## 2023-07-20 NOTE — Discharge Summary (Signed)
 Physician Discharge Summary  LENVILLE HIBBERD WUJ:[id number] DOB: 07/24/1940 DOA: 07/17/2023  PCP: Ignatius Specking, MD  Admit date: 07/17/2023 Discharge date: 07/20/2023  Admitted From: Home Disposition: Home  Recommendations for Outpatient Follow-up:  Follow up with PCP in 1 week with repeat CBC/BMP Outpatient follow-up with urology Follow up in ED if symptoms worsen or new appear   Home Health: Home health PT Equipment/Devices: None  Discharge Condition: Stable CODE STATUS: Full Diet recommendation: Regular  Brief/Interim Summary: 83 y.o. male with history of CAD status post CABG, prior history of ureteral stricture status post endoscopy repair, hypertension presented with abdominal discomfort.  On presentation, creatinine was 3.3. Bladder scan showed fluid retention and also CT scan of the abdomen showed bilateral hydronephrosis with the right sided obstructing distal ureteral stone.  UA showed pyuria and positive leukocyte esterase.  He was started on IV fluids and antibiotics.  Urology was consulted.  He underwent cystoscopy with cystolitholapaxy, right ureteroscopy laser lithotripsy and left ureteroscopy with insertion of right ureteral stent on 07/18/2023 by urology.  Subsequently, his renal function is improving.  Urine cultures have been negative so far.  Urology has cleared him for discharge and recommend outpatient follow-up with urology with no need for further antibiotics.  Discharge patient home today.  Discharge Diagnoses:   Obstructive uropathy leading to bilateral hydronephrosis with right-sided obstructing distal ureteral stone UTI/acute pyelonephritis: Present on admission AKI -Presented with abdominal pain with creatinine of 3.3 and UA and imaging as above. -underwent cystoscopy with cystolitholapaxy, right ureteroscopy laser lithotripsy and left ureteroscopy with insertion of right ureteral stent on 07/18/2023 by urology - Urology recommending to continue Foley catheter for 10  to 14 days.  Outpatient follow-up with urology for stent removal -Continue tamsulosin.  Resume Avodart - Currently on broad-spectrum IV antibiotics.  Urine cultures negative so far.  Urology recommended no further antibiotics needed on discharge. -Creatinine improving to 2.21 today.  Treated with IV fluids.  Encourage oral intake.  Outpatient follow-up with BMP -Urology has cleared him for discharge.  Discharge patient home today.  Continue Foley catheter on discharge.   Cholelithiasis -No signs of acute cholecystitis.  Outpatient follow-up with surgery if needed   Leukocytosis -Mild.  Monitor as an outpatient   Anemia of chronic disease -From chronic illnesses.  Hemoglobin stable   Hyponatremia -Resolved   Hypertension -Blood pressure on the higher side.  Resume atenolol.   History of CAD status post CABG -Stable.  No chest pain.  Outpatient follow-up with cardiology   Cognitive impairment/dementia - Continue home regimen.  Outpatient follow-up with PCP  Physical deconditioning - Will need home health PT  Discharge Instructions   Allergies as of 07/20/2023       Reactions   Innovar [fentanyl-droperidol] Anaphylaxis   Aspiration   Cephalexin Other (See Comments)   Cephalosporins Other (See Comments)   "Had Hepatitis from water and was advised to stay away of these medications"   Codeine Nausea And Vomiting        Medication List     TAKE these medications    atenolol 50 MG tablet Commonly known as: TENORMIN Take 50 mg by mouth every morning.   citalopram 20 MG tablet Commonly known as: CELEXA Take 20 mg by mouth daily.   dutasteride 0.5 MG capsule Commonly known as: AVODART Take 0.5 mg by mouth daily.   memantine 5 MG tablet Commonly known as: NAMENDA Take 5 mg by mouth 2 (two) times daily.   simvastatin 20 MG tablet  Commonly known as: ZOCOR Take 20 mg by mouth at bedtime.   tamsulosin 0.4 MG Caps capsule Commonly known as: FLOMAX Take 1 capsule  (0.4 mg total) by mouth daily after supper.               Durable Medical Equipment  (From admission, onward)           Start     Ordered   07/19/23 1104  For home use only DME Walker rolling  Once       Question Answer Comment  Walker: With 5 Inch Wheels   Patient needs a walker to treat with the following condition Difficulty in walking, not elsewhere classified      07/19/23 1103            Follow-up Information     Vyas, Dhruv B, MD. Schedule an appointment as soon as possible for a visit in 1 week(s).   Specialty: Internal Medicine Contact information: [address] [place] [address] [phone]         Milderd Meager., MD. Schedule an appointment as soon as possible for a visit in 1 week(s).   Specialty: Urology Contact information: [address] [place] [address] [phone]                Allergies  Allergen Reactions   Innovar [Fentanyl-Droperidol] Anaphylaxis    Aspiration   Cephalexin Other (See Comments)   Cephalosporins Other (See Comments)    "Had Hepatitis from water and was advised to stay away of these medications"   Codeine Nausea And Vomiting    Consultations: Urology   Procedures/Studies: DG C-Arm 1-60 Min-No Report Result Date: 07/18/2023 Fluoroscopy was utilized by the requesting physician.  No radiographic interpretation.   CT ABDOMEN PELVIS WO CONTRAST Result Date: 07/17/2023 CLINICAL DATA:  Abdominal pain.  Urinary retention. EXAM: CT ABDOMEN AND PELVIS WITHOUT CONTRAST TECHNIQUE: Multidetector CT imaging of the abdomen and pelvis was performed following the standard protocol without IV contrast. RADIATION DOSE REDUCTION: This exam was performed according to the departmental dose-optimization program which includes automated exposure control, adjustment of the mA and/or kV according to patient size and/or use of iterative reconstruction technique. COMPARISON:  CT abdomen pelvis dated  10/07/2013. FINDINGS: Evaluation of this exam is limited in the absence of intravenous contrast. Lower chest: The visualized lung bases are clear. There is coronary vascular calcification and calcification of the mitral annulus. No intra-abdominal free air or free fluid. Hepatobiliary: The liver is unremarkable. No biliary dilatation. Layering small stones in the gallbladder. No pericholecystic fluid or evidence of acute cholecystitis by CT. Pancreas: Unremarkable. No pancreatic ductal dilatation or surrounding inflammatory changes. Spleen: Normal in size without focal abnormality. Adrenals/Urinary Tract: The adrenal glands are unremarkable. Mild bilateral hydronephrosis, right greater than left. There is mild right hydroureter. No stones noted in the kidneys. The urinary bladder is decompressed around a Foley catheter. Multiple stones noted within the urinary bladder. There is a 6 mm partially obstructing stone in the distal right ureter. Three adjacent stones along the right posterior bladder wall measuring up to 4 mm may be within the bladder lumen or at the ureterovesical junction. There is diffuse thickening of the bladder wall which may be partly related to underdistention. Cystitis is not excluded. Correlation with urinalysis recommended. Stomach/Bowel: There is sigmoid diverticulosis. There is no bowel obstruction or active inflammation. The appendix is not visualized with certainty. No inflammatory changes identified in the right lower  quadrant. Vascular/Lymphatic: Moderate aortoiliac atherosclerotic disease. The IVC is unremarkable. No portal venous gas. There is no adenopathy. Reproductive: The prostate and seminal vesicles are grossly unremarkable. No pelvic mass. Other: None Musculoskeletal: Osteopenia with degenerative changes of the spine. No acute osseous pathology. IMPRESSION: 1. Mild bilateral hydronephrosis, right greater than left. A 6 mm partially obstructing stone in the distal right ureter.  Multiple bladder calculi. Three adjacent stones along the right posterior bladder wall measuring up to 4 mm may be within the bladder lumen or at the ureterovesical junction. 2. Sigmoid diverticulosis. No bowel obstruction. 3. Cholelithiasis. Electronically Signed   By: Elgie Collard M.D.   On: 07/17/2023 18:17      Subjective: Patient seen and examined at bedside.  No fever, vomiting, agitation reported.  Discharge Exam: Vitals:   07/19/23 2138 07/20/23 0643  BP: (!) 149/70 (!) 153/89  Pulse: 70 72  Resp: 20 20  Temp: (!) 97.3 F (36.3 C) 98.6 F (37 C)  SpO2: 100% 98%    General: Pt is alert, awake, not in acute distress.  Chronically ill and deconditioned looking.  Slow to respond, flat affect.  Poor historian. Foley catheter present.  On room air. Cardiovascular: rate controlled, S1/S2 + Respiratory: bilateral decreased breath sounds at bases Abdominal: Soft, NT, ND, bowel sounds + Extremities: no edema, no cyanosis    The results of significant diagnostics from this hospitalization (including imaging, microbiology, ancillary and laboratory) are listed below for reference.     Microbiology: Recent Results (from the past 240 hours)  Urine Culture     Status: None   Collection Time: 07/18/23  5:20 AM   Specimen: Urine, Random  Result Value Ref Range Status   Specimen Description   Final    URINE, RANDOM Performed at [facility], [address] [place], [place] [address]    Special Requests   Final    NONE Reflexed from [id number] Performed at [facility], [address], [place], [place] [address]    Culture   Final    NO GROWTH Performed at [facility], [address] [place], [place] [address]    Report Status 07/19/2023 FINAL  Final     Labs: BNP (last 3 results) No results for input(s): "BNP" in the last 8760 hours. Basic Metabolic Panel: Recent Labs  Lab 07/17/23 1938 07/17/23 2351 07/18/23 0756  07/19/23 0611 07/20/23 0608  NA 137 139 141 144 143  K 3.9 4.0 3.7 5.0 3.7  CL 101 104 108 111 109  CO2 26 24 24 22 25   GLUCOSE 110* 109* 107* 139* 116*  BUN 67* 67* 60* 52* 38*  CREATININE 3.00* 3.01* 2.82* 2.52* 2.21*  CALCIUM 9.0 8.9 8.7* 8.8* 8.4*  MG 2.1  --   --  1.9 1.8   Liver Function Tests: Recent Labs  Lab 07/17/23 1545 07/18/23 0756  AST 14* 16  ALT 11 12  ALKPHOS 67 69  BILITOT 0.6 1.0  PROT 7.3 6.8  ALBUMIN 4.2 3.5   Recent Labs  Lab 07/17/23 1545  LIPASE 166*   No results for input(s): "AMMONIA" in the last 168 hours. CBC: Recent Labs  Lab 07/17/23 1545 07/18/23 0756 07/19/23 0611 07/20/23 0608  WBC 11.5* 10.7* 11.6* 11.8*  NEUTROABS 8.4* 8.0* 10.2* 8.6*  HGB 12.0* 13.0 12.9* 12.2*  HCT 34.6* 38.5* 38.5* 36.2*  MCV 90.8 94.6 94.4 95.0  PLT 372 342 355 304   Cardiac Enzymes: No results for input(s): "  CKTOTAL", "CKMB", "CKMBINDEX", "TROPONINI" in the last 168 hours. BNP: Invalid input(s): "POCBNP" CBG: No results for input(s): "GLUCAP" in the last 168 hours. D-Dimer No results for input(s): "DDIMER" in the last 72 hours. Hgb A1c No results for input(s): "HGBA1C" in the last 72 hours. Lipid Profile No results for input(s): "CHOL", "HDL", "LDLCALC", "TRIG", "CHOLHDL", "LDLDIRECT" in the last 72 hours. Thyroid function studies No results for input(s): "TSH", "T4TOTAL", "T3FREE", "THYROIDAB" in the last 72 hours.  Invalid input(s): "FREET3" Anemia work up Recent Labs    07/18/23 0756  VITAMINB12 341  FOLATE 6.4  FERRITIN 527*  TIBC 269  IRON 69  RETICCTPCT 1.2   Urinalysis    Component Value Date/Time   COLORURINE YELLOW 07/18/2023 0520   APPEARANCEUR CLOUDY (A) 07/18/2023 0520   LABSPEC 1.006 07/18/2023 0520   PHURINE 5.0 07/18/2023 0520   GLUCOSEU NEGATIVE 07/18/2023 0520   HGBUR MODERATE (A) 07/18/2023 0520   BILIRUBINUR NEGATIVE 07/18/2023 0520   KETONESUR NEGATIVE 07/18/2023 0520   PROTEINUR 100 (A) 07/18/2023 0520    UROBILINOGEN 1.0 10/07/2013 0506   NITRITE NEGATIVE 07/18/2023 0520   LEUKOCYTESUR LARGE (A) 07/18/2023 0520   Sepsis Labs Recent Labs  Lab 07/17/23 1545 07/18/23 0756 07/19/23 0611 07/20/23 0608  WBC 11.5* 10.7* 11.6* 11.8*   Microbiology Recent Results (from the past 240 hours)  Urine Culture     Status: None   Collection Time: 07/18/23  5:20 AM   Specimen: Urine, Random  Result Value Ref Range Status   Specimen Description   Final    URINE, RANDOM Performed at [facility], [address] [place], [place] [address]    Special Requests   Final    NONE Reflexed from [id number] Performed at [facility], [address], [place], [place] [address]    Culture   Final    NO GROWTH Performed at [facility], [address] [place], [place] [address]    Report Status 07/19/2023 FINAL  Final     Time coordinating discharge: 35 minutes  SIGNED:   Glade Lloyd, MD  [facility] 07/20/2023, 8:13 AM

## 2023-07-20 NOTE — Progress Notes (Signed)
 Patient is being discharged home. Partner at beside. Discharged instructions reviewed, verbalized full understanding of instructions. [name], patients partner is providing ride home.

## 2023-07-20 NOTE — Treatment Plan (Signed)
 UROLOGY TREATMENT PLAN NOTE:  Patient now postop day 2 from bilateral ureteroscopy with right ureteral stent placement and stone removal.  Patient is currently afebrile hemodynamically stable.  Labs with creatinine continued to improve about 2.2 from 2.52 yesterday.  Hemoglobin remained stable.  Urine culture currently no growth.  At this time, discharge per primary team.  From urology perspective, patient should be okay for discharge given his creatinine has continued to improve.  We will follow-up with patient for trial of void as well as stent removal.  Please DC patient with Foley catheter in place with Foley teaching.  Jerald Kief, MD, PhD [facility] Resident  [facility]

## 2023-07-27 DIAGNOSIS — Z299 Encounter for prophylactic measures, unspecified: Secondary | ICD-10-CM | POA: Diagnosis not present

## 2023-07-27 DIAGNOSIS — K8020 Calculus of gallbladder without cholecystitis without obstruction: Secondary | ICD-10-CM | POA: Diagnosis not present

## 2023-07-27 DIAGNOSIS — N179 Acute kidney failure, unspecified: Secondary | ICD-10-CM | POA: Diagnosis not present

## 2023-07-27 DIAGNOSIS — I10 Essential (primary) hypertension: Secondary | ICD-10-CM | POA: Diagnosis not present

## 2023-07-27 DIAGNOSIS — N400 Enlarged prostate without lower urinary tract symptoms: Secondary | ICD-10-CM | POA: Diagnosis not present

## 2023-07-27 DIAGNOSIS — Z09 Encounter for follow-up examination after completed treatment for conditions other than malignant neoplasm: Secondary | ICD-10-CM | POA: Diagnosis not present

## 2023-07-27 DIAGNOSIS — K5790 Diverticulosis of intestine, part unspecified, without perforation or abscess without bleeding: Secondary | ICD-10-CM | POA: Diagnosis not present

## 2023-08-01 ENCOUNTER — Ambulatory Visit: Admitting: Urology

## 2023-08-01 ENCOUNTER — Encounter: Payer: Self-pay | Admitting: Urology

## 2023-08-01 VITALS — BP 123/69 | HR 65 | Ht 72.0 in | Wt 195.0 lb

## 2023-08-01 DIAGNOSIS — N179 Acute kidney failure, unspecified: Secondary | ICD-10-CM

## 2023-08-01 DIAGNOSIS — Z466 Encounter for fitting and adjustment of urinary device: Secondary | ICD-10-CM

## 2023-08-01 DIAGNOSIS — N138 Other obstructive and reflux uropathy: Secondary | ICD-10-CM

## 2023-08-01 DIAGNOSIS — N210 Calculus in bladder: Secondary | ICD-10-CM

## 2023-08-01 DIAGNOSIS — R339 Retention of urine, unspecified: Secondary | ICD-10-CM

## 2023-08-01 DIAGNOSIS — N201 Calculus of ureter: Secondary | ICD-10-CM

## 2023-08-01 MED ORDER — CIPROFLOXACIN HCL 500 MG PO TABS
500.0000 mg | ORAL_TABLET | Freq: Once | ORAL | Status: AC
  Administered 2023-08-01: 500 mg via ORAL

## 2023-08-01 NOTE — Progress Notes (Signed)
 Assessment: 1. Ureteral calculus, right   2. BPH with obstruction/lower urinary tract symptoms   3. Urinary retention   4. AKI (acute kidney injury) (HCC)   5. Bladder calculi     Plan: Right ureteral stent removed today. Foley catheter removed following voiding trial.  He was unable to void in the office but likely has a very large capacity bladder which was able to be adequately filled during the voiding trial. Cipro 500 mg x 1 following procedure. Continue tamsulosin 0.4 mg daily and dutasteride. Return to office in 10-14 days for bladder scan.  Chief Complaint: Chief Complaint  Patient presents with   Nephrolithiasis    HPI: Warren Rojas is a 83 y.o. male who presents for continued evaluation of a right ureteral calculus, bladder calculi, bilateral hydronephrosis, urinary retention and AKI. He presented to the emergency room on 07/17/2023 with decreased p.o. intake and abdominal pain.  He was found to have AKI with a creatinine of 3.31.  A Foley catheter was placed with return of 3 L of urine.  CT imaging showed bilateral hydronephrosis, 6 mm distal right ureteral calculus, and multiple bladder calculi.  His renal function improved with bladder drainage.  He was taken to the operating room on 07/18/2023 for cystoscopy, bilateral retrograde pyelograms, cystolitholopaxy, right ureteroscopic laser lithotripsy, left ureteroscopy, and insertion of a right ureteral stent without tether.  No urethral strictures were identified on cystoscopy.  He was noted to have bilobar enlargement of the prostate with elevation of the bladder neck and a large capacity bladder with trabeculations and cellules.  Multiple bladder calculi were removed.  A 6 mm calculus in the distal right ureter was treated with laser lithotripsy.  A right ureteral stent was placed. His renal function improved and his creatinine was 2.21 on 07/20/2023.  He was discharged home with the Foley catheter in place.  He has continued on  tamsulosin and Avodart. Creatinine from 07/27/2023 improved to 1.27.  He presents today for cystoscopy, stent removal, and voiding trial.  He continues on tamsulosin and dutasteride.  His catheter has been draining well.  No gross hematuria.  Portions of the above documentation were copied from a prior visit for review purposes only.  Allergies: Allergies  Allergen Reactions   Innovar [Fentanyl-Droperidol] Anaphylaxis    Aspiration   Cephalexin Other (See Comments)   Cephalosporins Other (See Comments)    "Had Hepatitis from water and was advised to stay away of these medications"   Codeine Nausea And Vomiting    PMH: Past Medical History:  Diagnosis Date   Bladder outlet obstruction    BPH (benign prostatic hypertrophy)    Colon polyp 2010   Coronary atherosclerosis of native coronary artery    Multivessel, normal LVEF   Depression 1997   Diverticulosis    Essential hypertension, benign    Hepatitis A 1/92   Kidney stone    Mixed hyperlipidemia    Personal history of rectal adenoma 03/23/2009   Skin cancer     PSH: Past Surgical History:  Procedure Laterality Date   APPENDECTOMY     COLONOSCOPY  03/2009   tortuous colon   CORONARY ARTERY BYPASS GRAFT  2004   Dr. Tyrone Sage - LIMA to LAD, RIMA to ramus, left radial to OM, SVG to diagonal, SVG to PDA   CYSTOSCOPY     CYSTOSCOPY/URETEROSCOPY/HOLMIUM LASER/STENT PLACEMENT Right 07/18/2023   Procedure: CYSTOSCOPY/bilateral URETEROSCOPY/  right HOLMIUM LASER/  right STENT PLACEMENT/ bilateral Retrograde pylogram;  Surgeon: [name]  J., MD;  Location: [facility];  Service: Urology;  Laterality: Right;   Deviated nasal septum repair  1963   EUS N/A 12/25/2013   Procedure: UPPER ENDOSCOPIC ULTRASOUND (EUS) LINEAR;  Surgeon: Janel Medford, MD;  Location: [facility];  Service: Endoscopy;  Laterality: N/A;   Resection of urethral diverticulum  1975   TONSILLECTOMY      SH: Social History   Tobacco Use   Smoking  status: Former    Current packs/day: 0.00    Types: Cigarettes    Quit date: 04/18/1963    Years since quitting: 60.3   Smokeless tobacco: Never   Tobacco comments:    smoked 3 packs total while in [employer]  Substance Use Topics   Alcohol use: No   Drug use: No    ROS: Constitutional:  Negative for fever, chills, weight loss CV: Negative for chest pain, previous MI, hypertension Respiratory:  Negative for shortness of breath, wheezing, sleep apnea, frequent cough GI:  Negative for nausea, vomiting, bloody stool, GERD  PE: BP 123/69   Pulse 65   Ht 6' (1.829 m)   Wt 195 lb (88.5 kg)   BMI 26.45 kg/m  GENERAL APPEARANCE:  Well appearing, well developed, well nourished, NAD HEENT:  Atraumatic, normocephalic, oropharynx clear NECK:  Supple without lymphadenopathy or thyromegaly ABDOMEN:  Soft, non-tender, no masses EXTREMITIES:  Moves all extremities well, without clubbing, cyanosis, or edema NEUROLOGIC:  Alert and oriented x 3, normal gait, CN II-XII grossly intact MENTAL STATUS:  appropriate BACK:  Non-tender to palpation, No CVAT SKIN:  Warm, dry, and intact   Results: None  Procedure:  Flexible Cystourethroscopy/Stent removal  Pre-operative Diagnosis:  right ureteral calculus  Post-operative Diagnosis:  Right ureteral calculus  Anesthesia:  local with lidocaine jelly  Surgical Narrative:  After appropriate informed consent was obtained, the patient was prepped and draped in the usual sterile fashion in the supine position.  The patient was correctly identified and the proper procedure delineated prior to proceeding.  Sterile lidocaine gel was instilled in the urethra. The flexible cystoscope was introduced without difficulty. The right ureteral stent was identified and removed using stent graspers. The cystoscope was then removed.  The patient tolerated the procedure well.  Procedure:  VOIDING TRIAL  A voiding trial was performed in the office today.   Volume of  sterile water instilled: 100 mL Foley catheter removed intact. Volume voided by patient: 0 mL Instructed to return to office if has not voided by 4 PM

## 2023-08-01 NOTE — Progress Notes (Signed)
 Catheter Removal  Patient is present today for a catheter removal.  9ml of water was drained from the balloon. A 18FR coude foley cath was removed from the bladder, no complications were noted. Patient tolerated well.  Performed by: Luticia Tadros CMA

## 2023-08-06 ENCOUNTER — Telehealth: Payer: Self-pay | Admitting: Urology

## 2023-08-06 NOTE — Telephone Encounter (Signed)
 Son called to make Dr. Willye Harvey aware that hospice had to cath Warren Rojas again on 08/04/23. Patient  still was not able to void. Patient has apt on 08/08/23. Thanks.

## 2023-08-08 ENCOUNTER — Encounter: Payer: Self-pay | Admitting: Urology

## 2023-08-08 ENCOUNTER — Encounter: Admitting: Urology

## 2023-08-08 ENCOUNTER — Ambulatory Visit: Admitting: Urology

## 2023-08-08 VITALS — BP 141/72 | HR 79 | Ht 72.0 in | Wt 196.0 lb

## 2023-08-08 DIAGNOSIS — R338 Other retention of urine: Secondary | ICD-10-CM | POA: Diagnosis not present

## 2023-08-08 DIAGNOSIS — N401 Enlarged prostate with lower urinary tract symptoms: Secondary | ICD-10-CM | POA: Insufficient documentation

## 2023-08-08 DIAGNOSIS — N138 Other obstructive and reflux uropathy: Secondary | ICD-10-CM

## 2023-08-08 DIAGNOSIS — R339 Retention of urine, unspecified: Secondary | ICD-10-CM | POA: Insufficient documentation

## 2023-08-08 MED ORDER — SILODOSIN 8 MG PO CAPS: 8.0000 mg | ORAL_CAPSULE | Freq: Every day | ORAL | 11 refills | Status: DC

## 2023-08-08 NOTE — Progress Notes (Signed)
 Assessment: 1. BPH with obstruction/lower urinary tract symptoms   2. Urinary retention     Plan: I had a lengthy discussion with the patient and his family regarding options for management of his urinary retention.  He has a very large capacity bladder and I am suspicious that he has some component of a hypotonic bladder which may be contributing to his urinary retention in addition to a component of obstruction from his large volume BPH.  I discussed options for management including continued Foley catheter drainage, intermittent catheterization, suprapubic tube placement, alternate medical therapy for his BPH, and surgical therapy for his BPH.  I advised him that given his large volume BPH, surgical management with HoLEP or simple prostatectomy would be advised.  He is not a good candidate for minimally invasive procedures.  I also recommended that we consider urodynamic evaluation prior to any surgical intervention.   His family does not think that he would be able to perform intermittent catheterization. He would like to continue with the Foley catheter at this time. Will discontinue the tamsulosin  and begin silodosin . Continue dutasteride . Return to office in 2-3 weeks for voiding trial.  I personally spent 30 minutes involved in face to face and non-face-to-face activities for this patient on the day of the visit.  Professional time spent included the following activities, in addition to those noted in the documentation: Discussion of management options as noted above with multiple questions from the family answered.    Chief Complaint: Chief Complaint  Patient presents with   Benign Prostatic Hypertrophy    HPI: Warren Rojas is a 83 y.o. male who presents for continued evaluation of a right ureteral calculus, bladder calculi, bilateral hydronephrosis, urinary retention and AKI. He presented to the emergency room on 07/17/2023 with decreased p.o. intake and abdominal pain.  He was  found to have AKI with a creatinine of 3.31.  A Foley catheter was placed with return of 3 L of urine.  CT imaging showed bilateral hydronephrosis, 6 mm distal right ureteral calculus, and multiple bladder calculi.  His renal function improved with bladder drainage.  He was taken to the operating room on 07/18/2023 for cystoscopy, bilateral retrograde pyelograms, cystolitholopaxy, right ureteroscopic laser lithotripsy, left ureteroscopy, and insertion of a right ureteral stent without tether.  No urethral strictures were identified on cystoscopy.  He was noted to have bilobar enlargement of the prostate with elevation of the bladder neck and a large capacity bladder with trabeculations and cellules.  Multiple bladder calculi were removed.  A 6 mm calculus in the distal right ureter was treated with laser lithotripsy.  A right ureteral stent was placed. His renal function improved and his creatinine was 2.21 on 07/20/2023.  He was discharged home with the Foley catheter in place.  He has continued on tamsulosin  and Avodart . Creatinine from 07/27/2023 improved to 1.27. His right ureteral stent was removed on 08/01/2023.  His Foley catheter was removed at that time as well.  He was unable to void in the office. His Foley catheter was replaced by hospice on 08/04/2023 due to inability to void.  His family reports that approximately 3 L was drained from his bladder.  His Foley has been draining well.  He continues on tamsulosin  and dutasteride .   Prostate volume measures approximately 93 cm from recent CT.   Portions of the above documentation were copied from a prior visit for review purposes only.  Allergies: Allergies  Allergen Reactions   Innovar [Fentanyl -Droperidol] Anaphylaxis  Aspiration   Cephalexin Other (See Comments)   Cephalosporins Other (See Comments)    "Had Hepatitis from water and was advised to stay away of these medications"   Codeine Nausea And Vomiting    PMH: Past Medical  History:  Diagnosis Date   Bladder outlet obstruction    BPH (benign prostatic hypertrophy)    Colon polyp 2010   Coronary atherosclerosis of native coronary artery    Multivessel, normal LVEF   Depression 1997   Diverticulosis    Essential hypertension, benign    Hepatitis A 1/92   Kidney stone    Mixed hyperlipidemia    Personal history of rectal adenoma 03/23/2009   Skin cancer     PSH: Past Surgical History:  Procedure Laterality Date   APPENDECTOMY     COLONOSCOPY  03/2009   tortuous colon   CORONARY ARTERY BYPASS GRAFT  2004   Dr. Nicanor Barge - LIMA to LAD, RIMA to ramus, left radial to OM, SVG to diagonal, SVG to PDA   CYSTOSCOPY     CYSTOSCOPY/URETEROSCOPY/HOLMIUM LASER/STENT PLACEMENT Right 07/18/2023   Procedure: CYSTOSCOPY/bilateral URETEROSCOPY/  right HOLMIUM LASER/  right STENT PLACEMENT/ bilateral Retrograde pylogram;  Surgeon: Mellie Sprinkle., MD;  Location: [facility];  Service: Urology;  Laterality: Right;   Deviated nasal septum repair  1963   EUS N/A 12/25/2013   Procedure: UPPER ENDOSCOPIC ULTRASOUND (EUS) LINEAR;  Surgeon: Janel Medford, MD;  Location: [facility];  Service: Endoscopy;  Laterality: N/A;   Resection of urethral diverticulum  1975   TONSILLECTOMY      SH: Social History   Tobacco Use   Smoking status: Former    Current packs/day: 0.00    Types: Cigarettes    Quit date: 04/18/1963    Years since quitting: 60.3   Smokeless tobacco: Never   Tobacco comments:    smoked 3 packs total while in [employer]  Substance Use Topics   Alcohol  use: No   Drug use: No    ROS: Constitutional:  Negative for fever, chills, weight loss CV: Negative for chest pain, previous MI, hypertension Respiratory:  Negative for shortness of breath, wheezing, sleep apnea, frequent cough GI:  Negative for nausea, vomiting, bloody stool, GERD  PE: BP (!) 141/72   Pulse 79   Ht 6' (1.829 m)   Wt 196 lb (88.9 kg)   BMI 26.58 kg/m  GENERAL APPEARANCE:  Well  appearing, well developed, well nourished, NAD HEENT:  Atraumatic, normocephalic, oropharynx clear NECK:  Supple without lymphadenopathy or thyromegaly ABDOMEN:  Soft, non-tender, no masses EXTREMITIES:  Moves all extremities well, without clubbing, cyanosis, or edema NEUROLOGIC:  Alert and oriented x 3, normal gait, CN II-XII grossly intact MENTAL STATUS:  appropriate BACK:  Non-tender to palpation, No CVAT SKIN:  Warm, dry, and intact   Results: None

## 2023-08-26 NOTE — Progress Notes (Unsigned)
 Assessment: 1. Urinary retention   2. BPH with obstruction/lower urinary tract symptoms     Plan: I had a lengthy discussion with the patient and his family regarding options for management of his urinary retention.  He has a very large capacity bladder and I am suspicious that he has some component of a hypotonic bladder which may be contributing to his urinary retention in addition to a component of obstruction from his large volume BPH.  I discussed options for management including continued Foley catheter drainage, intermittent catheterization, suprapubic tube placement, alternate medical therapy for his BPH, and surgical therapy for his BPH.  I advised him that given his large volume BPH, surgical management with HoLEP or simple prostatectomy would be advised.  He is not a good candidate for minimally invasive procedures.  I also recommended that we consider urodynamic evaluation prior to any surgical intervention.   His family does not think that he would be able to perform intermittent catheterization. He was unable to void in the office today.  He would like to go home and see if he is able to void there. Continue silodosin . Continue dutasteride . Cipro  x 1 following Foley removal. Return to office in7-10 days for bladder scan Patient advised to return to the office or to the emergency room if he is unable to void later today.   Chief Complaint: Chief Complaint  Patient presents with   Urinary Retention    HPI: Warren Rojas is a 83 y.o. male who presents for continued evaluation of a right ureteral calculus, bladder calculi, bilateral hydronephrosis, urinary retention and AKI. He presented to the emergency room on 07/17/2023 with decreased p.o. intake and abdominal pain.  He was found to have AKI with a creatinine of 3.31.  A Foley catheter was placed with return of 3 L of urine.  CT imaging showed bilateral hydronephrosis, 6 mm distal right ureteral calculus, and multiple bladder  calculi.  His renal function improved with bladder drainage.  He was taken to the operating room on 07/18/2023 for cystoscopy, bilateral retrograde pyelograms, cystolitholopaxy, right ureteroscopic laser lithotripsy, left ureteroscopy, and insertion of a right ureteral stent without tether.  No urethral strictures were identified on cystoscopy.  He was noted to have bilobar enlargement of the prostate with elevation of the bladder neck and a large capacity bladder with trabeculations and cellules.  Multiple bladder calculi were removed.  A 6 mm calculus in the distal right ureter was treated with laser lithotripsy.  A right ureteral stent was placed. His renal function improved and his creatinine was 2.21 on 07/20/2023.  He was discharged home with the Foley catheter in place.  He has continued on tamsulosin  and Avodart . Creatinine from 07/27/2023 improved to 1.27. His right ureteral stent was removed on 08/01/2023.  His Foley catheter was removed at that time as well.  He was unable to void in the office. His Foley catheter was replaced by hospice on 08/04/2023 due to inability to void.  His family reports that approximately 3 L was drained from his bladder. At the time of his visit on 08/08/2023, his catheter was draining well.  He continued on tamsulosin  and dutasteride .  I had a lengthy discussion with the patient and his family at that time regarding options for management of his urinary retention.  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  I discussed options for management given his large prostate volume including HoLEP and simple prostatectomy. He was changed to silodosin  at his  visit on 08/08/2023.  Prostate volume measures approximately 93 cm from recent CT.  He returns today for follow-up.  His Foley catheter has been draining well.  No gross hematuria.  He is tolerating the silodosin .   Portions of the above documentation were copied from a prior visit for review purposes  only.  Allergies: Allergies  Allergen Reactions   Innovar [Fentanyl -Droperidol] Anaphylaxis    Aspiration   Cephalexin Other (See Comments)   Cephalosporins Other (See Comments)    "Had Hepatitis from water and was advised to stay away of these medications"   Codeine Nausea And Vomiting   Lorazepam      PMH: Past Medical History:  Diagnosis Date   Bladder outlet obstruction    BPH (benign prostatic hypertrophy)    Colon polyp 2010   Coronary atherosclerosis of native coronary artery    Multivessel, normal LVEF   Depression 1997   Diverticulosis    Essential hypertension, benign    Hepatitis A 1/92   Kidney stone    Mixed hyperlipidemia    Personal history of rectal adenoma 03/23/2009   Skin cancer     PSH: Past Surgical History:  Procedure Laterality Date   APPENDECTOMY     COLONOSCOPY  03/2009   tortuous colon   CORONARY ARTERY BYPASS GRAFT  2004   Dr. Nicanor Barge - LIMA to LAD, RIMA to ramus, left radial to OM, SVG to diagonal, SVG to PDA   CYSTOSCOPY     CYSTOSCOPY/URETEROSCOPY/HOLMIUM LASER/STENT PLACEMENT Right 07/18/2023   Procedure: CYSTOSCOPY/bilateral URETEROSCOPY/  right HOLMIUM LASER/  right STENT PLACEMENT/ bilateral Retrograde pylogram;  Surgeon: Mellie Sprinkle., MD;  Location: [facility];  Service: Urology;  Laterality: Right;   Deviated nasal septum repair  1963   EUS N/A 12/25/2013   Procedure: UPPER ENDOSCOPIC ULTRASOUND (EUS) LINEAR;  Surgeon: Janel Medford, MD;  Location: [facility];  Service: Endoscopy;  Laterality: N/A;   Resection of urethral diverticulum  1975   TONSILLECTOMY      SH: Social History   Tobacco Use   Smoking status: Former    Current packs/day: 0.00    Types: Cigarettes    Quit date: 04/18/1963    Years since quitting: 60.4   Smokeless tobacco: Never   Tobacco comments:    smoked 3 packs total while in [employer]  Substance Use Topics   Alcohol  use: No   Drug use: No    ROS: Constitutional:  Negative for fever, chills,  weight loss CV: Negative for chest pain, previous MI, hypertension Respiratory:  Negative for shortness of breath, wheezing, sleep apnea, frequent cough GI:  Negative for nausea, vomiting, bloody stool, GERD  PE: BP (!) 148/76   Pulse 85  GENERAL APPEARANCE:  Well appearing, well developed, well nourished, NAD HEENT:  Atraumatic, normocephalic, oropharynx clear NECK:  Supple without lymphadenopathy or thyromegaly ABDOMEN:  Soft, non-tender, no masses EXTREMITIES:  Moves all extremities well, without clubbing, cyanosis, or edema NEUROLOGIC:  Alert and oriented x 3, normal gait, CN II-XII grossly intact MENTAL STATUS:  appropriate BACK:  Non-tender to palpation, No CVAT SKIN:  Warm, dry, and intact  Results: None   Procedure:  VOIDING TRIAL  A voiding trial was performed in the office today.   Volume of sterile water instilled: 450 mL Foley catheter removed intact. Volume voided by patient: 0 mL Instructed to return to office if has not voided by 4 PM

## 2023-08-27 ENCOUNTER — Ambulatory Visit: Admitting: Urology

## 2023-08-27 ENCOUNTER — Encounter: Payer: Self-pay | Admitting: Urology

## 2023-08-27 VITALS — BP 148/76 | HR 85

## 2023-08-27 DIAGNOSIS — N138 Other obstructive and reflux uropathy: Secondary | ICD-10-CM | POA: Diagnosis not present

## 2023-08-27 DIAGNOSIS — N401 Enlarged prostate with lower urinary tract symptoms: Secondary | ICD-10-CM

## 2023-08-27 DIAGNOSIS — R338 Other retention of urine: Secondary | ICD-10-CM | POA: Diagnosis not present

## 2023-08-27 DIAGNOSIS — R339 Retention of urine, unspecified: Secondary | ICD-10-CM

## 2023-08-27 MED ORDER — CIPROFLOXACIN HCL 500 MG PO TABS
500.0000 mg | ORAL_TABLET | Freq: Once | ORAL | Status: AC
  Administered 2023-08-27: 500 mg via ORAL

## 2023-08-27 NOTE — Progress Notes (Signed)
 Fill and Pull Catheter Removal  Patient is present today for a catheter removal.  Patient was cleaned and prepped in a sterile fashion 450ml of sterile water was instilled into the bladder when the patient felt the urge to urinate, 10ml of water was then drained from the balloon.  A 18FR foley cath was removed from the bladder no complications were noted .  Patient was then given some time to void on their own.  Patient cannot void. Patient tolerated well.   Performed by: Verlin Uher CMA

## 2023-08-30 ENCOUNTER — Encounter (HOSPITAL_COMMUNITY): Payer: Self-pay

## 2023-08-30 ENCOUNTER — Emergency Department (HOSPITAL_COMMUNITY)

## 2023-08-30 ENCOUNTER — Emergency Department (HOSPITAL_COMMUNITY)
Admission: EM | Admit: 2023-08-30 | Discharge: 2023-08-30 | Disposition: A | Discharge status: Home / Self Care | Attending: Emergency Medicine | Admitting: Emergency Medicine

## 2023-08-30 ENCOUNTER — Other Ambulatory Visit: Payer: Self-pay

## 2023-08-30 DIAGNOSIS — R339 Retention of urine, unspecified: Secondary | ICD-10-CM | POA: Insufficient documentation

## 2023-08-30 LAB — BASIC METABOLIC PANEL WITH GFR
Anion gap: 10 (ref 5–15)
BUN: 20 mg/dL (ref 8–23)
CO2: 21 mmol/L — ABNORMAL LOW (ref 22–32)
Calcium: 9.4 mg/dL (ref 8.9–10.3)
Chloride: 103 mmol/L (ref 98–111)
Creatinine, Ser: 1.25 mg/dL — ABNORMAL HIGH (ref 0.61–1.24)
GFR, Estimated: 58 mL/min — ABNORMAL LOW (ref >60)
Glucose, Bld: 134 mg/dL — ABNORMAL HIGH (ref 70–99)
Potassium: 4 mmol/L (ref 3.5–5.1)
Sodium: 134 mmol/L — ABNORMAL LOW (ref 135–145)

## 2023-08-30 LAB — URINALYSIS, ROUTINE W REFLEX MICROSCOPIC
Bilirubin Urine: NEGATIVE
Glucose, UA: NEGATIVE mg/dL
Hgb urine dipstick: NEGATIVE
Ketones, ur: NEGATIVE mg/dL
Leukocytes,Ua: NEGATIVE
Nitrite: NEGATIVE
Protein, ur: NEGATIVE mg/dL
Specific Gravity, Urine: 1.012 (ref 1.005–1.030)
pH: 5 (ref 5.0–8.0)

## 2023-08-30 MED ORDER — LIDOCAINE HCL URETHRAL/MUCOSAL 2 % EX GEL
1.0000 | Freq: Once | CUTANEOUS | Status: AC
  Administered 2023-08-30: 1 via URETHRAL
  Filled 2023-08-30: qty 20

## 2023-08-30 NOTE — ED Triage Notes (Signed)
 Pt arrived via POV c/o recurrent bladder outlet obstruction. Pt recently had foley cath removed and reports having Hx of this problem. Per family, Pt had output of urine last time he was catheterized. Pts bladder presents distended. Pt is AMS at baseline and per family, Pt is Hospice Pt.

## 2023-08-30 NOTE — ED Provider Notes (Signed)
 [place] EMERGENCY DEPARTMENT AT [facility] Provider Note   CSN: [id number] Arrival date & time: 08/30/23  1028     History  Chief Complaint  Patient presents with   oliguria    Warren Rojas is a 83 y.o. male.  He presents to the ER today for evaluation of urinary retention.  Patient has dementia and is on hospice.  He is accompanied by his son and son-in-law at bedside.  They state he had a Foley catheter placed at the beginning of April, he had removed 3 days ago in the urologist office and they noticed that he has been having increased abdominal distention, not able to empty his bladder since they took this out.  Note that they were told at the urologist office that he would likely need chronic Foley catheter if he was not able to void after having the Foley catheter placed.  They also note that they have secured him placement at memory care unit and are awaiting some paperwork to get him placed.  They are hoping he could potentially go there today.  HPI     Home Medications Prior to Admission medications   Medication Sig Start Date End Date Taking? Authorizing Provider  atenolol (TENORMIN) 50 MG tablet Take 50 mg by mouth every morning.     [provider]  citalopram  (CELEXA ) 20 MG tablet Take 20 mg by mouth daily. 06/14/23   [provider]  dutasteride  (AVODART ) 0.5 MG capsule Take 0.5 mg by mouth daily.    [provider]  LORazepam  (ATIVAN ) 0.5 MG tablet Take 0.5 mg by mouth every 6 (six) hours as needed. 07/29/23   [provider]  memantine  (NAMENDA ) 5 MG tablet Take 5 mg by mouth 2 (two) times daily. 06/22/23   [provider]  silodosin  (RAPAFLO ) 8 MG CAPS capsule Take 1 capsule (8 mg total) by mouth daily with breakfast. 08/08/23   Stoneking, Ponce Brisker., MD  simvastatin  (ZOCOR ) 20 MG tablet Take 20 mg by mouth at bedtime.     [provider]      Allergies    Innovar [fentanyl -droperidol], Cephalexin,  Cephalosporins, Codeine, and Lorazepam     Review of Systems   Review of Systems  Physical Exam Updated Vital Signs BP (!) 136/93   Pulse 81   Temp 97.6 F (36.4 C) (Oral)   Resp 16   Ht 6' (1.829 m)   Wt 88.9 kg   SpO2 95%   BMI 26.58 kg/m  Physical Exam Vitals and nursing note reviewed.  Constitutional:      General: He is not in acute distress.    Appearance: He is well-developed.  HENT:     Head: Normocephalic and atraumatic.     Mouth/Throat:     Mouth: Mucous membranes are moist.  Eyes:     Extraocular Movements: Extraocular movements intact.     Conjunctiva/sclera: Conjunctivae normal.     Pupils: Pupils are equal, round, and reactive to light.  Cardiovascular:     Rate and Rhythm: Normal rate and regular rhythm.     Heart sounds: No murmur heard. Pulmonary:     Effort: Pulmonary effort is normal. No respiratory distress.     Breath sounds: Normal breath sounds.  Abdominal:     Palpations: Abdomen is soft.     Tenderness: There is no abdominal tenderness.  Musculoskeletal:        General: No swelling.     Cervical back: Neck supple.  Skin:  General: Skin is warm and dry.     Capillary Refill: Capillary refill takes less than 2 seconds.  Neurological:     General: No focal deficit present.     Mental Status: He is alert and oriented to person, place, and time.  Psychiatric:        Mood and Affect: Mood normal.     ED Results / Procedures / Treatments   Labs (all labs ordered are listed, but only abnormal results are displayed) Labs Reviewed  BASIC METABOLIC PANEL WITH GFR - Abnormal; Notable for the following components:      Result Value   Sodium 134 (*)    CO2 21 (*)    Glucose, Bld 134 (*)    Creatinine, Ser 1.25 (*)    GFR, Estimated 58 (*)    All other components within normal limits  URINALYSIS, ROUTINE W REFLEX MICROSCOPIC    EKG None  Radiology DG Chest Portable 1 View Result Date: 08/30/2023 CLINICAL DATA:  83 year old male with  TB rule out EXAM: PORTABLE CHEST 1 VIEW COMPARISON:  10/06/2013 FINDINGS: Cardiomediastinal silhouette unchanged in size and contour. No evidence of central vascular congestion. No interlobular septal thickening. Surgical changes of median sternotomy and CABG No pneumothorax or pleural effusion. Coarsened interstitial markings, with no confluent airspace disease. No acute displaced fracture. Degenerative changes of the spine. IMPRESSION: No active disease. Electronically Signed   By: Myrlene Asper D.O.   On: 08/30/2023 16:32    Procedures Procedures    Medications Ordered in ED Medications  lidocaine  (XYLOCAINE ) 2 % jelly 1 Application (1 Application Urethral Given 08/30/23 1203)    ED Course/ Medical Decision Making/ A&P                                 Medical Decision Making This patient presents to the ED for concern of urinary retention, this involves an extensive number of treatment options, and is a complaint that carries with it a high risk of complications and morbidity.  The differential diagnosis includes BPH, AKI, UTI, other   Co morbidities that complicate the patient evaluation :   dementia, BPH   Additional history obtained:  Additional history obtained from EMR External records from outside source obtained and reviewed including prior notes, labs   Lab Tests:  I Ordered, and personally interpreted labs.  The pertinent results include:  Pt's kidney function has improved from previous. No UTI, anemia    I independently visualized and interpreted imaging within scope of identifying emergent findings  I agree with the radiologist interpretation     Problem List / ED Course / Critical interventions / Medication management  Pt here for urinary retention, he had almost 5 L of urine drained via foley.  Checked kidney function as last time he had this he has due to AKI and bilateral hydronephrosis.  Kidney function is much improved from his discharge creatinine last  month.  Will leave Foley in place.  Family was hoping he could go to [facility] memory [facility] today.  We had a chest x-ray as they were asking for proof that patient was free of TB, we did get this, unfortunately patient ultimately cannot go to the memory care center until Tuesday of next week, patient's family here states they are able to get him home and have him cared for until then.  Advised on follow-up and return precautions.  I have reviewed the patients home  medicines and have made adjustments as needed   Amount and/or Complexity of Data Reviewed Labs: ordered. Radiology: ordered.           Final Clinical Impression(s) / ED Diagnoses Final diagnoses:  Urinary retention    Rx / DC Orders ED Discharge Orders     None         Aimee Houseman, PA-C 08/30/23 1826    Jerilynn Montenegro, MD 08/31/23 1622

## 2023-08-30 NOTE — Discharge Instructions (Addendum)
 With the urologist for urinary retention.  Come back to the ER for new or worsening symptoms.

## 2023-08-30 NOTE — ED Notes (Signed)
 Family requested lidocaine  gel for foley insertion. Provider notified.

## 2023-08-30 NOTE — ED Notes (Signed)
 Pt given lunch per provider approval

## 2023-09-07 ENCOUNTER — Ambulatory Visit: Admitting: Urology

## 2023-09-07 ENCOUNTER — Encounter: Payer: Self-pay | Admitting: Urology

## 2023-09-07 VITALS — BP 146/77 | HR 87

## 2023-09-07 DIAGNOSIS — R338 Other retention of urine: Secondary | ICD-10-CM | POA: Diagnosis not present

## 2023-09-07 DIAGNOSIS — R339 Retention of urine, unspecified: Secondary | ICD-10-CM

## 2023-09-07 DIAGNOSIS — N138 Other obstructive and reflux uropathy: Secondary | ICD-10-CM | POA: Diagnosis not present

## 2023-09-07 DIAGNOSIS — N401 Enlarged prostate with lower urinary tract symptoms: Secondary | ICD-10-CM | POA: Diagnosis not present

## 2023-09-07 NOTE — Progress Notes (Signed)
 Assessment: 1. Urinary retention   2. BPH with obstruction/lower urinary tract symptoms     Plan: I reviewed with the patient and his wife options for management of his ongoing urinary retention.  He has a very large capacity bladder and I am suspicious that he has some component of a hypotonic bladder which may be contributing to his urinary retention in addition to a component of obstruction from his large volume BPH.   I again discussed options for management including continued Foley catheter drainage, intermittent catheterization, suprapubic tube placement, and surgical therapy for his BPH.  I advised him that given his large volume BPH, surgical management with HoLEP or simple prostatectomy would be advised.  He would not be a good candidate for minimally invasive procedures.   I discussed  urodynamic evaluation prior to any surgical intervention.   Continue silodosin . Continue dutasteride . Return to office in 3 weeks for possible catheter change. They will contact me with her decision regarding further management.  I personally spent 30 minutes involved in face to face and non-face-to-face activities for this patient on the day of the visit.  Professional time spent included the following activities, in addition to those noted in the documentation: Review of records, discussion of current situation and management options as noted above  Chief Complaint: Chief Complaint  Patient presents with   Urinary Retention    HPI: Warren Rojas is a 83 y.o. male who presents for continued evaluation of a right ureteral calculus, bladder calculi, bilateral hydronephrosis, urinary retention and AKI. He presented to the emergency room on 07/17/2023 with decreased p.o. intake and abdominal pain.  He was found to have AKI with a creatinine of 3.31.  A Foley catheter was placed with return of 3 L of urine.  CT imaging showed bilateral hydronephrosis, 6 mm distal right ureteral calculus, and multiple  bladder calculi.  His renal function improved with bladder drainage.  He was taken to the operating room on 07/18/2023 for cystoscopy, bilateral retrograde pyelograms, cystolitholopaxy, right ureteroscopic laser lithotripsy, left ureteroscopy, and insertion of a right ureteral stent without tether.  No urethral strictures were identified on cystoscopy.  He was noted to have bilobar enlargement of the prostate with elevation of the bladder neck and a large capacity bladder with trabeculations and cellules.  Multiple bladder calculi were removed.  A 6 mm calculus in the distal right ureter was treated with laser lithotripsy.  A right ureteral stent was placed. His renal function improved and his creatinine was 2.21 on 07/20/2023.  He was discharged home with the Foley catheter in place.  He has continued on tamsulosin  and Avodart . Creatinine from 07/27/2023 improved to 1.27. His right ureteral stent was removed on 08/01/2023.  His Foley catheter was removed at that time as well.  He was unable to void in the office. His Foley catheter was replaced by hospice on 08/04/2023 due to inability to void.  His family reports that approximately 3 L was drained from his bladder. At the time of his visit on 08/08/2023, his catheter was draining well.  He continued on tamsulosin  and dutasteride .  I had a lengthy discussion with the patient and his family at that time regarding options for management of his urinary retention.  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  I discussed options for management given his large prostate volume including HoLEP and simple prostatectomy. He was changed to silodosin  at his visit on 08/08/2023.  Prostate volume measures approximately 93 cm from  recent CT.  His Foley was removed on 08/27/2023.  He was unable to void in the office.  He returned to the emergency room on 08/30/2023 with retention and a Foley catheter was placed.  He had almost 5 L of urine in his bladder at that  time.  He returns today for follow-up.  His Foley catheter is draining well.  No gross hematuria.  He continues on silodosin  and dutasteride .  Portions of the above documentation were copied from a prior visit for review purposes only.  Allergies: Allergies  Allergen Reactions   Innovar [Fentanyl -Droperidol] Anaphylaxis    Aspiration   Cephalexin Other (See Comments)   Cephalosporins Other (See Comments)    "Had Hepatitis from water and was advised to stay away of these medications"   Codeine Nausea And Vomiting   Lorazepam      PMH: Past Medical History:  Diagnosis Date   Bladder outlet obstruction    BPH (benign prostatic hypertrophy)    Colon polyp 2010   Coronary atherosclerosis of native coronary artery    Multivessel, normal LVEF   Depression 1997   Diverticulosis    Essential hypertension, benign    Hepatitis A 1/92   Kidney stone    Mixed hyperlipidemia    Personal history of rectal adenoma 03/23/2009   Skin cancer     PSH: Past Surgical History:  Procedure Laterality Date   APPENDECTOMY     COLONOSCOPY  03/2009   tortuous colon   CORONARY ARTERY BYPASS GRAFT  2004   Dr. Nicanor Barge - LIMA to LAD, RIMA to ramus, left radial to OM, SVG to diagonal, SVG to PDA   CYSTOSCOPY     CYSTOSCOPY/URETEROSCOPY/HOLMIUM LASER/STENT PLACEMENT Right 07/18/2023   Procedure: CYSTOSCOPY/bilateral URETEROSCOPY/  right HOLMIUM LASER/  right STENT PLACEMENT/ bilateral Retrograde pylogram;  Surgeon: Mellie Sprinkle., MD;  Location: [facility];  Service: Urology;  Laterality: Right;   Deviated nasal septum repair  1963   EUS N/A 12/25/2013   Procedure: UPPER ENDOSCOPIC ULTRASOUND (EUS) LINEAR;  Surgeon: Janel Medford, MD;  Location: [facility];  Service: Endoscopy;  Laterality: N/A;   Resection of urethral diverticulum  1975   TONSILLECTOMY      SH: Social History   Tobacco Use   Smoking status: Former    Current packs/day: 0.00    Types: Cigarettes    Quit date: 04/18/1963     Years since quitting: 60.4    Passive exposure: Past   Smokeless tobacco: Never   Tobacco comments:    smoked 3 packs total while in [employer] Use   Vaping status: Never Used  Substance Use Topics   Alcohol  use: No   Drug use: No    ROS: Constitutional:  Negative for fever, chills, weight loss CV: Negative for chest pain, previous MI, hypertension Respiratory:  Negative for shortness of breath, wheezing, sleep apnea, frequent cough GI:  Negative for nausea, vomiting, bloody stool, GERD  PE: BP (!) 146/77   Pulse 87  GENERAL APPEARANCE:  Well appearing, well developed, well nourished, NAD HEENT:  Atraumatic, normocephalic, oropharynx clear NECK:  Supple without lymphadenopathy or thyromegaly ABDOMEN:  Soft, non-tender, no masses EXTREMITIES:  Moves all extremities well, without clubbing, cyanosis, or edema NEUROLOGIC:  Alert and oriented x 3, CN II-XII grossly intact BACK:  Non-tender to palpation, No CVAT SKIN:  Warm, dry, and intact GU:  foley draining clear urine  Results: None

## 2023-09-17 ENCOUNTER — Telehealth: Payer: Self-pay | Admitting: Urology

## 2023-09-17 NOTE — Telephone Encounter (Signed)
 Patient soon Warren Rojas called about the flow test pt is suppose to be having done. Pt son stated it has not been scheduled yet and is wondering if it should be done before pt follow up on the 16th of June. Also he wanted to let you know that his father has been put in assisted living. Please Advise.

## 2023-09-18 NOTE — Telephone Encounter (Signed)
 Returned son's call, went straight to VM. Left detailed message regarding urodynamics and advised that pt should keep appt on 6/16 for foley change. Advised son to return call to let us  know if pt would like to pursue urodynamics or not.

## 2023-09-20 NOTE — Telephone Encounter (Signed)
 Pt son called back and stated he was not sure about the urodynamics due to them having more question. Pt son requested for a call back and to try him on his office phone [phone]. Please advise.

## 2023-09-20 NOTE — Telephone Encounter (Signed)
 Returned son's call at the number below, he has some in depth questions ab urodynamics, and if his father would even be a candidate for surgery at 37 with dementia. Advised him I would let the provider know that he has questions. Son was not at last appt with father and sister.

## 2023-09-24 ENCOUNTER — Telehealth: Payer: Self-pay | Admitting: Urology

## 2023-09-24 NOTE — Telephone Encounter (Signed)
 Pt son Etter Hermann called and wanted to make you aware that Mr. Rosol pulled his cath out again and Hospice nurse came and reinserted.

## 2023-09-25 NOTE — Telephone Encounter (Signed)
 Pt son Warren Rojas called and wanted to make you aware that Warren Rojas pulled his cath out again and Hospice nurse came and reinserted.

## 2023-10-01 ENCOUNTER — Ambulatory Visit: Admitting: Urology

## 2023-11-01 ENCOUNTER — Telehealth: Payer: Self-pay | Admitting: Urology

## 2023-11-01 ENCOUNTER — Encounter: Payer: Self-pay | Admitting: Urology

## 2023-11-01 ENCOUNTER — Ambulatory Visit (INDEPENDENT_AMBULATORY_CARE_PROVIDER_SITE_OTHER): Admitting: Urology

## 2023-11-01 VITALS — BP 151/89 | HR 78 | Wt 191.0 lb

## 2023-11-01 DIAGNOSIS — R339 Retention of urine, unspecified: Secondary | ICD-10-CM

## 2023-11-01 DIAGNOSIS — N401 Enlarged prostate with lower urinary tract symptoms: Secondary | ICD-10-CM

## 2023-11-01 DIAGNOSIS — R338 Other retention of urine: Secondary | ICD-10-CM

## 2023-11-01 DIAGNOSIS — N138 Other obstructive and reflux uropathy: Secondary | ICD-10-CM

## 2023-11-01 NOTE — Progress Notes (Signed)
 Cath Change/ Replacement  Patient is present today for a catheter change due to urinary retention.  10mL of water was removed from the balloon, a 16FR foley cath was removed without difficulty.  Patient was cleaned and prepped in a sterile fashion with betadine and 2% lidocaine  jelly was instilled into the urethra. A 16 FR foley cath was replaced into the bladder, no complications were noted. Urine return was noted 15mL and urine was yellow in color. The balloon was filled with 10ml of sterile water. A leg bag was attached for drainage.  A night bag was also given to the patient and patient was given instruction on how to change from one bag to another. Patient was given proper instruction on catheter care.    Performed by: Juliona Vales CMA

## 2023-11-01 NOTE — Telephone Encounter (Signed)
 [facility] needs printed orders for continued Dutasteride  and [facility] in [place] are also requesting a urinalysis order for suspected uti.  Fax# [phone]   ATTN-Paula

## 2023-11-01 NOTE — Progress Notes (Signed)
 Assessment: 1. Urinary retention   2. BPH with obstruction/lower urinary tract symptoms     Plan: I again reviewed with the patient, his wife, and son-in-law options for management of his ongoing urinary retention.  I reviewed the findings of  a very large capacity bladder and I am suspicious that he has some component of a hypotonic bladder which may be contributing to his urinary retention in addition to a component of obstruction from his large volume BPH.   I discussed options for management of urinary retention including continued Foley catheter drainage, intermittent catheterization, suprapubic tube placement, and surgical therapy for his BPH.  The patient and his family are not interested in pursuing surgical management or further evaluation with urodynamics.  They would like to continue Foley catheter drainage at this time. Discontinue silodosin  as he is being managed with an indwelling Foley catheter. Continue dutasteride . Foley catheter changed today. Foley catheter change monthly by home health care. Return to office in 3 months.  Chief Complaint: Chief Complaint  Patient presents with   Urinary Retention    HPI: Warren Rojas is a 83 y.o. male who presents for continued evaluation of a urinary retention with chronic indwelling Foley. He presented to the emergency room on 07/17/2023 with decreased p.o. intake and abdominal pain.  He was found to have AKI with a creatinine of 3.31.  A Foley catheter was placed with return of 3 L of urine.  CT imaging showed bilateral hydronephrosis, 6 mm distal right ureteral calculus, and multiple bladder calculi.  His renal function improved with bladder drainage.  He was taken to the operating room on 07/18/2023 for cystoscopy, bilateral retrograde pyelograms, cystolitholopaxy, right ureteroscopic laser lithotripsy, left ureteroscopy, and insertion of a right ureteral stent without tether.  No urethral strictures were identified on cystoscopy.  He  was noted to have bilobar enlargement of the prostate with elevation of the bladder neck and a large capacity bladder with trabeculations and cellules.  Multiple bladder calculi were removed.  A 6 mm calculus in the distal right ureter was treated with laser lithotripsy.  A right ureteral stent was placed. His renal function improved and his creatinine was 2.21 on 07/20/2023.  He was discharged home with the Foley catheter in place.  He has continued on tamsulosin  and Avodart . Creatinine from 07/27/2023 improved to 1.27. His right ureteral stent was removed on 08/01/2023.  His Foley catheter was removed at that time as well.  He was unable to void in the office. His Foley catheter was replaced by hospice on 08/04/2023 due to inability to void.  His family reports that approximately 3 L was drained from his bladder. At the time of his visit on 08/08/2023, his catheter was draining well.  He continued on tamsulosin  and dutasteride .  I had a lengthy discussion with the patient and his family at that time regarding options for management of his urinary retention.  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  I discussed options for management given his large prostate volume including HoLEP and simple prostatectomy. He was changed to silodosin  at his visit on 08/08/2023.  Prostate volume measures approximately 93 cm from recent CT.  His Foley was removed on 08/27/2023.  He was unable to void while in the office.  He returned to the emergency room on 08/30/2023 with retention and a Foley catheter was placed.  He had almost 5 L of urine in his bladder at that time. At the time of his visit in  May 2025, he continued with a Foley catheter.  I had a lengthy discussion with the family regarding further evaluation of his urinary retention and management options.  We discussed further evaluation with urodynamics.  At that time, they elected to continue with Foley catheter drainage.  He returns today for follow-up.   He has had to have the catheter replaced by home health care after it was accidentally removed.  The catheter has been draining well.  No gross hematuria.  No fevers or chills.  Portions of the above documentation were copied from a prior visit for review purposes only.  Allergies: Allergies  Allergen Reactions   Innovar [Fentanyl -Droperidol] Anaphylaxis    Aspiration   Cephalexin Other (See Comments)   Cephalosporins Other (See Comments)    Had Hepatitis from water and was advised to stay away of these medications   Codeine Nausea And Vomiting   Lorazepam      PMH: Past Medical History:  Diagnosis Date   Bladder outlet obstruction    BPH (benign prostatic hypertrophy)    Colon polyp 2010   Coronary atherosclerosis of native coronary artery    Multivessel, normal LVEF   Depression 1997   Diverticulosis    Essential hypertension, benign    Hepatitis A 1/92   Kidney stone    Mixed hyperlipidemia    Personal history of rectal adenoma 03/23/2009   Skin cancer     PSH: Past Surgical History:  Procedure Laterality Date   APPENDECTOMY     COLONOSCOPY  03/2009   tortuous colon   CORONARY ARTERY BYPASS GRAFT  2004   Dr. Army - LIMA to LAD, RIMA to ramus, left radial to OM, SVG to diagonal, SVG to PDA   CYSTOSCOPY     CYSTOSCOPY/URETEROSCOPY/HOLMIUM LASER/STENT PLACEMENT Right 07/18/2023   Procedure: CYSTOSCOPY/bilateral URETEROSCOPY/  right HOLMIUM LASER/  right STENT PLACEMENT/ bilateral Retrograde pylogram;  Surgeon: Roseann Adine PARAS., MD;  Location: [facility];  Service: Urology;  Laterality: Right;   Deviated nasal septum repair  1963   EUS N/A 12/25/2013   Procedure: UPPER ENDOSCOPIC ULTRASOUND (EUS) LINEAR;  Surgeon: Toribio SHAUNNA Cedar, MD;  Location: [facility];  Service: Endoscopy;  Laterality: N/A;   Resection of urethral diverticulum  1975   TONSILLECTOMY      SH: Social History   Tobacco Use   Smoking status: Former    Current packs/day: 0.00    Types:  Cigarettes    Quit date: 04/18/1963    Years since quitting: 60.5    Passive exposure: Past   Smokeless tobacco: Never   Tobacco comments:    smoked 3 packs total while in [employer] Use   Vaping status: Never Used  Substance Use Topics   Alcohol  use: No   Drug use: No    ROS: Constitutional:  Negative for fever, chills, weight loss CV: Negative for chest pain, previous MI, hypertension Respiratory:  Negative for shortness of breath, wheezing, sleep apnea, frequent cough GI:  Negative for nausea, vomiting, bloody stool, GERD   PE: BP (!) 151/89   Pulse 78  GENERAL APPEARANCE:  Well appearing, well developed, well nourished, NAD HEENT:  Atraumatic, normocephalic, oropharynx clear NECK:  Supple  ABDOMEN:  Soft, non-tender, no masses EXTREMITIES:  Moves all extremities well, without clubbing, cyanosis, or edema NEUROLOGIC:  Alert and oriented x 3,  CN II-XII grossly intact MENTAL STATUS:  appropriate BACK:  Non-tender to palpation, No CVAT SKIN:  Warm, dry, and intact GU:  foley draining clear urine  Results: None  Procedure: Foley insertion  Foley catheter insertion is accomplished under sterile conditions using a 16 Fr. catheter. Ten ml of sterile water is left in the retention balloon. There is the immediate return of 15 ml of urine. Tolerated well without complications. Foley catheter is left to gravity drainage.

## 2023-11-02 ENCOUNTER — Other Ambulatory Visit: Payer: Self-pay | Admitting: Urology

## 2023-11-02 MED ORDER — DUTASTERIDE 0.5 MG PO CAPS: 0.5000 mg | ORAL_CAPSULE | Freq: Every day | ORAL | 11 refills | Status: AC

## 2023-11-02 NOTE — Telephone Encounter (Signed)
 Pt son Eddy called about his father he stated that   a order stating to Discontinue silodosin  needs to also be sent to the nursing home. [phone] Attn. Vina. Fax

## 2023-11-02 NOTE — Telephone Encounter (Signed)
 Attempted to reach [facility] for phone number to [facility] as there are multiple locations and we only have a fax number, unable to reach [facility]. Faxing office visit note from 11/01/23 stating for pt to d/c silodosin  to [facility].

## 2023-11-02 NOTE — Telephone Encounter (Signed)
 Urinalysis order and Rx for dutasteride  faxed to [facility] with confirmation.

## 2023-11-03 ENCOUNTER — Other Ambulatory Visit: Payer: Self-pay

## 2023-11-03 ENCOUNTER — Encounter (HOSPITAL_COMMUNITY): Payer: Self-pay

## 2023-11-03 ENCOUNTER — Emergency Department (HOSPITAL_COMMUNITY)

## 2023-11-03 ENCOUNTER — Emergency Department (HOSPITAL_COMMUNITY)
Admission: EM | Admit: 2023-11-03 | Discharge: 2023-11-03 | Disposition: A | Discharge status: Home / Self Care | Attending: Emergency Medicine | Admitting: Emergency Medicine

## 2023-11-03 DIAGNOSIS — I2510 Atherosclerotic heart disease of native coronary artery without angina pectoris: Secondary | ICD-10-CM | POA: Insufficient documentation

## 2023-11-03 DIAGNOSIS — Z85828 Personal history of other malignant neoplasm of skin: Secondary | ICD-10-CM | POA: Insufficient documentation

## 2023-11-03 DIAGNOSIS — I10 Essential (primary) hypertension: Secondary | ICD-10-CM | POA: Insufficient documentation

## 2023-11-03 DIAGNOSIS — K8500 Idiopathic acute pancreatitis without necrosis or infection: Secondary | ICD-10-CM | POA: Diagnosis not present

## 2023-11-03 DIAGNOSIS — K8020 Calculus of gallbladder without cholecystitis without obstruction: Secondary | ICD-10-CM | POA: Diagnosis not present

## 2023-11-03 DIAGNOSIS — F0390 Unspecified dementia without behavioral disturbance: Secondary | ICD-10-CM | POA: Diagnosis not present

## 2023-11-03 DIAGNOSIS — R112 Nausea with vomiting, unspecified: Secondary | ICD-10-CM | POA: Diagnosis present

## 2023-11-03 LAB — CBC WITH DIFFERENTIAL/PLATELET
Abs Immature Granulocytes: 0.07 K/uL (ref 0.00–0.07)
Basophils Absolute: 0 K/uL (ref 0.0–0.1)
Basophils Relative: 0 %
Eosinophils Absolute: 0 K/uL (ref 0.0–0.5)
Eosinophils Relative: 0 %
HCT: 40 % (ref 39.0–52.0)
Hemoglobin: 13.3 g/dL (ref 13.0–17.0)
Immature Granulocytes: 1 %
Lymphocytes Relative: 4 %
Lymphs Abs: 0.5 K/uL — ABNORMAL LOW (ref 0.7–4.0)
MCH: 31.5 pg (ref 26.0–34.0)
MCHC: 33.3 g/dL (ref 30.0–36.0)
MCV: 94.8 fL (ref 80.0–100.0)
Monocytes Absolute: 0.5 K/uL (ref 0.1–1.0)
Monocytes Relative: 3 %
Neutro Abs: 12.3 K/uL — ABNORMAL HIGH (ref 1.7–7.7)
Neutrophils Relative %: 92 %
Platelets: 257 K/uL (ref 150–400)
RBC: 4.22 MIL/uL (ref 4.22–5.81)
RDW: 11.9 % (ref 11.5–15.5)
WBC: 13.3 K/uL — ABNORMAL HIGH (ref 4.0–10.5)
nRBC: 0 % (ref 0.0–0.2)

## 2023-11-03 LAB — COMPREHENSIVE METABOLIC PANEL WITH GFR
ALT: 94 U/L — ABNORMAL HIGH (ref 0–44)
AST: 217 U/L — ABNORMAL HIGH (ref 15–41)
Albumin: 3.7 g/dL (ref 3.5–5.0)
Alkaline Phosphatase: 131 U/L — ABNORMAL HIGH (ref 38–126)
Anion gap: 14 (ref 5–15)
BUN: 18 mg/dL (ref 8–23)
CO2: 24 mmol/L (ref 22–32)
Calcium: 9.2 mg/dL (ref 8.9–10.3)
Chloride: 100 mmol/L (ref 98–111)
Creatinine, Ser: 0.95 mg/dL (ref 0.61–1.24)
GFR, Estimated: >60 mL/min (ref >60)
Glucose, Bld: 164 mg/dL — ABNORMAL HIGH (ref 70–99)
Potassium: 3.6 mmol/L (ref 3.5–5.1)
Sodium: 138 mmol/L (ref 135–145)
Total Bilirubin: 1 mg/dL (ref 0.0–1.2)
Total Protein: 6.8 g/dL (ref 6.5–8.1)

## 2023-11-03 LAB — TROPONIN I (HIGH SENSITIVITY): Troponin I (High Sensitivity): 9 ng/L (ref <18)

## 2023-11-03 LAB — LIPASE, BLOOD: Lipase: 555 U/L — ABNORMAL HIGH (ref 11–51)

## 2023-11-03 MED ORDER — IOHEXOL 300 MG/ML  SOLN
100.0000 mL | Freq: Once | INTRAMUSCULAR | Status: AC | PRN
  Administered 2023-11-03: 100 mL via INTRAVENOUS

## 2023-11-03 MED ORDER — ONDANSETRON 4 MG PO TBDP: 4.0000 mg | ORAL_TABLET | Freq: Three times a day (TID) | ORAL | 0 refills | Status: AC | PRN

## 2023-11-03 NOTE — ED Provider Notes (Signed)
 [place] EMERGENCY DEPARTMENT AT [facility] Provider Note   CSN: [id number] Arrival date & time: 11/03/23  9987     Patient presents with: Emesis  Level 5 caveat due to dementia Warren Rojas is a 83 y.o. male.   The history is provided by the patient and a relative. The history is limited by the condition of the patient.  Emesis Patient with history of CAD, dementia, chronic indwelling Foley presents with nausea and vomiting and chest and abdominal pain.  Due to his dementia, most of the history is provided by his son Patient resides in an assisted living facility.  It is reported that patient called another son reporting chest pain in the up to 7 episodes of nonbloody emesis.  There is been no diarrhea.  He appears to be feeling improved.  No new medications.  He had otherwise been at his baseline.  Son reports he has had a previous episode several this but never this severe.  Previous surgeries include CABG, appendectomy as a child   Patient is a DNR.  Son reports that he had been placed on hospice previously but will likely graduate from hospice this fall Past Medical History:  Diagnosis Date   Bladder outlet obstruction    BPH (benign prostatic hypertrophy)    Colon polyp 2010   Coronary atherosclerosis of native coronary artery    Multivessel, normal LVEF   Depression 1997   Diverticulosis    Essential hypertension, benign    Hepatitis A 1/92   Kidney stone    Mixed hyperlipidemia    Personal history of rectal adenoma 03/23/2009   Skin cancer     Prior to Admission medications   Medication Sig Start Date End Date Taking? Authorizing Provider  ondansetron  (ZOFRAN -ODT) 4 MG disintegrating tablet Take 1 tablet (4 mg total) by mouth every 8 (eight) hours as needed. 11/03/23  Yes Midge Golas, MD  citalopram  (CELEXA ) 20 MG tablet Take 20 mg by mouth daily. 06/14/23   [provider]  dutasteride  (AVODART ) 0.5 MG capsule Take 1 capsule (0.5 mg total)  by mouth daily. 11/02/23   Stoneking, Adine PARAS., MD  haloperidol (HALDOL) 1 MG tablet Take 0.5 mg by mouth every 6 (six) hours as needed. 07/31/23   [provider]  simvastatin  (ZOCOR ) 20 MG tablet Take 20 mg by mouth at bedtime.     [provider]    Allergies: Innovar [fentanyl -droperidol], Cephalexin, Cephalosporins, Codeine, and Lorazepam     Review of Systems  Unable to perform ROS: Dementia  Gastrointestinal:  Positive for vomiting.    Updated Vital Signs BP (!) 144/71   Pulse (!) 58   Temp 97.9 F (36.6 C) (Oral)   Resp 15   Ht 1.829 m (6')   Wt 89.4 kg   SpO2 95%   BMI 26.72 kg/m   Physical Exam CONSTITUTIONAL: Elderly, no acute distress HEAD: Normocephalic/atraumatic EYES: EOMI/PERRL no icterus ENMT: Mucous membranes moist NECK: supple no meningeal signs CV: S1/S2 noted, no murmurs/rubs/gallops noted LUNGS: Lungs are clear to auscultation bilaterally, no apparent distress ABDOMEN: soft, nontender, no rebound or guarding, bowel sounds noted throughout abdomen, negative Murphy sign GU:no cva tenderness Indwelling catheter noted on arrival that is draining yellow urine NEURO: Pt is awake/alert, moves all extremitiesx4.  No facial droop.  Patient is pleasantly confused EXTREMITIES: pulses normal/equal, full ROM SKIN: warm, color normal  (all labs ordered are listed, but only abnormal results are displayed) Labs Reviewed  CBC WITH DIFFERENTIAL/PLATELET - Abnormal;  Notable for the following components:      Result Value   WBC 13.3 (*)    Neutro Abs 12.3 (*)    Lymphs Abs 0.5 (*)    All other components within normal limits  COMPREHENSIVE METABOLIC PANEL WITH GFR - Abnormal; Notable for the following components:   Glucose, Bld 164 (*)    AST 217 (*)    ALT 94 (*)    Alkaline Phosphatase 131 (*)    All other components within normal limits  LIPASE, BLOOD - Abnormal; Notable for the following components:   Lipase 555 (*)    All other components  within normal limits  TROPONIN I (HIGH SENSITIVITY)    EKG: EKG Interpretation Date/Time:  Saturday November 03 2023 01:23:18 EDT Ventricular Rate:  57 PR Interval:  155 QRS Duration:  108 QT Interval:  493 QTC Calculation: 481 R Axis:   90  Text Interpretation: Sinus rhythm Borderline right axis deviation Nonspecific T abnormalities, lateral leads No significant change since last tracing Confirmed by Midge Golas ([id number]) on 11/03/2023 1:25:45 AM  Radiology: CT ABDOMEN PELVIS W CONTRAST Result Date: 11/03/2023 EXAM: CT ABDOMEN AND PELVIS WITH CONTRAST 11/03/2023 02:34:43 AM TECHNIQUE: CT of the abdomen and pelvis was performed with the administration of iohexol  (OMNIPAQUE ) 300 MG/ML solution. Multiplanar reformatted images are provided for review. Automated exposure control, iterative reconstruction, and/or weight based adjustment of the mA/kV was utilized to reduce the radiation dose to as low as reasonably achievable. COMPARISON: 07/17/2023 CLINICAL HISTORY: Abdominal pain, acute, nonlocalized; pancreatitis. Facility stated pt has had multiple episodes of nausea and vomiting. Denies seeing any blood. Pt complained of chest pain earlier today denies now. Zofran  given 4x. Dementia. Hospice. FINDINGS: LOWER CHEST: Mild scarring the lateral right middle lobe (image 5). LIVER: The liver is unremarkable. GALLBLADDER AND BILE DUCTS: Layering tiny gallstones (image 35). No intrahepatic or extrahepatic ductal dilatation. SPLEEN: No acute abnormality. PANCREAS: Mild peripancreatic inflammatory changes along the pancreatic head/uncinate process and pancreaticoduodenal groove (image 41). ADRENAL GLANDS: No acute abnormality. KIDNEYS, URETERS AND BLADDER: No stones in the kidneys or ureters. No hydronephrosis. No perinephric or periureteral stranding. Urinary bladder is thick-walled but decompressed by indwelling Foley catheter. GI AND BOWEL: Stomach demonstrates no acute abnormality. There is no bowel  obstruction. No bowel wall thickening. Sigmoid diverticulosis, with a diverticulitis. Appendix is not completely visualized. PERITONEUM AND RETROPERITONEUM: No ascites. No free air. VASCULATURE: Aorta is normal in caliber. LYMPH NODES: No lymphadenopathy. REPRODUCTIVE ORGANS: No acute abnormality. BONES AND SOFT TISSUES: Degenerative changes of the visualized thoracolumbar spine. Median sternotomy. IMPRESSION: 1. Mild acute pancreatitis. 2. Cholelithiasis, without associated inflammatory changes. 3. Possible secondary duodenitis. Electronically signed by: Pinkie Pebbles MD 11/03/2023 02:44 AM EDT RP Workstation: [id number]     Procedures   Medications Ordered in the ED  iohexol  (OMNIPAQUE ) 300 MG/ML solution 100 mL (100 mLs Intravenous Contrast Given 11/03/23 0216)    Clinical Course as of 11/03/23 0309  Sat Nov 03, 2023  0130 Patient with history of dementia presents with multiple episodes of nonbloody emesis and chest and abdominal pain.  At this time he is feeling improved and declines meds.  Of note, patient had CT imaging earlier this year that revealed cholelithiasis. No evidence of AAA on previous imaging EKG is unremarkable, troponin is negative.  Low suspicion for ACS at this time [DW]  0308 Patient has remained stable in the ER.  No vomiting.  Due to his abnormal labs, CT imaging was performed.  Mild pancreatitis  has been found.  No signs of any biliary duct dilatation He does have mild cholelithiasis without any secondary signs of infection  Patient has been resting comfortably, sleeping, no new pain or vomiting. [DW]  0308 I long discussion with patient and his son.  His son is a Engineer, civil (consulting) and well versed in his father's care.  They feel discharge is appropriate this time and can follow-up closely with gastroenterology and general surgery.  CT imaging does not reveal any [DW]  0309  emergent need for intervention at this time and since pain is managed and he is not vomiting he is safe  for discharge [DW]  0309 He has been given referral to GI as well as general surgery.  Zofran  has been ordered [DW]    Clinical Course User Index [DW] Midge Golas, MD                                 Medical Decision Making Amount and/or Complexity of Data Reviewed Labs: ordered. Radiology: ordered. ECG/medicine tests: ordered.  Risk Prescription drug management.   This patient presents to the ED for concern of vomiting and abdominal pain, this involves an extensive number of treatment options, and is a complaint that carries with it a high risk of complications and morbidity.  The differential diagnosis includes but is not limited to cholecystitis, cholelithiasis, pancreatitis, gastritis, peptic ulcer disease, appendicitis, bowel obstruction, bowel perforation, diverticulitis, AAA, ischemic bowel, acute coronary syndrome   Comorbidities that complicate the patient evaluation: Patient's presentation is complicated by their history of CAD, dementia  Social Determinants of Health: Patient's dementia  increases the complexity of managing their presentation  Additional history obtained: Additional history obtained from family Records reviewed previous admission documents  Lab Tests: I Ordered, and personally interpreted labs.  The pertinent results include: Leukocytosis  Imaging Studies ordered: I ordered imaging studies including CT scan abdomen pelvis  I independently visualized and interpreted imaging which showed mild pancreatitis I agree with the radiologist interpretation  Test Considered: Admission was considered, but since patient is improving, no vomiting, and not septic appearing, he is safe for discharge   Reevaluation: After the interventions noted above, I reevaluated the patient and found that they have :improved  Complexity of problems addressed: Patient's presentation is most consistent with  acute presentation with potential threat to life or bodily  function  Disposition: After consideration of the diagnostic results and the patient's response to treatment,  I feel that the patent would benefit from discharge  .   Of note, patient does not drink alcohol      Final diagnoses:  Idiopathic acute pancreatitis without infection or necrosis  Calculus of gallbladder without cholecystitis without obstruction    ED Discharge Orders          Ordered    ondansetron  (ZOFRAN -ODT) 4 MG disintegrating tablet  Every 8 hours PRN        11/03/23 0303               Midge Golas, MD 11/03/23 0310

## 2023-11-03 NOTE — ED Triage Notes (Signed)
 Facility stated pt has had multiple episodes of nausea and vomiting. Denies seeing any blood. Pt complained of chest pain earlier today denies now. Zofran  given 4x.  Dementia. Hospice.

## 2023-11-06 ENCOUNTER — Telehealth: Payer: Self-pay | Admitting: Urology

## 2023-11-06 NOTE — Telephone Encounter (Signed)
 [facility] Fax [phone] Attention : Harlene Batten RN PHONE [phone]  Patient was just here on the 17th to have Foley exchanged. It was recommended he do this monthly by Dr Roseann. This location mentioned above can do it monthly without having to move the patient or without him having to leave his residence. They just need an order from [employer]. Wants to know if this can be done.

## 2023-11-06 NOTE — Telephone Encounter (Signed)
 Spoke w Gustav at [facility], gave verbal order to exchange cath monthly. She will have Harlene return call if anything further is needed.

## 2023-12-08 DIAGNOSIS — G309 Alzheimer's disease, unspecified: Secondary | ICD-10-CM | POA: Diagnosis not present

## 2023-12-08 DIAGNOSIS — R339 Retention of urine, unspecified: Secondary | ICD-10-CM | POA: Diagnosis not present

## 2023-12-08 DIAGNOSIS — N410 Acute prostatitis: Secondary | ICD-10-CM | POA: Diagnosis not present

## 2023-12-08 DIAGNOSIS — F339 Major depressive disorder, recurrent, unspecified: Secondary | ICD-10-CM | POA: Diagnosis not present

## 2024-01-08 DIAGNOSIS — R0602 Shortness of breath: Secondary | ICD-10-CM | POA: Diagnosis not present

## 2024-01-08 DIAGNOSIS — R0989 Other specified symptoms and signs involving the circulatory and respiratory systems: Secondary | ICD-10-CM | POA: Diagnosis not present

## 2024-01-16 DIAGNOSIS — F331 Major depressive disorder, recurrent, moderate: Secondary | ICD-10-CM | POA: Diagnosis not present

## 2024-01-16 DIAGNOSIS — F01518 Vascular dementia, unspecified severity, with other behavioral disturbance: Secondary | ICD-10-CM | POA: Diagnosis not present

## 2024-02-02 ENCOUNTER — Emergency Department (HOSPITAL_COMMUNITY)

## 2024-02-02 ENCOUNTER — Other Ambulatory Visit: Payer: Self-pay

## 2024-02-02 ENCOUNTER — Emergency Department (HOSPITAL_COMMUNITY)
Admission: EM | Admit: 2024-02-02 | Discharge: 2024-02-02 | Disposition: A | Discharge status: Home / Self Care | Attending: Emergency Medicine | Admitting: Emergency Medicine

## 2024-02-02 ENCOUNTER — Encounter (HOSPITAL_COMMUNITY): Payer: Self-pay

## 2024-02-02 ENCOUNTER — Emergency Department (HOSPITAL_COMMUNITY)
Admission: EM | Admit: 2024-02-02 | Discharge: 2024-02-02 | Disposition: A | Discharge status: Home / Self Care | Source: Skilled Nursing Facility | Attending: Emergency Medicine | Admitting: Emergency Medicine

## 2024-02-02 DIAGNOSIS — I10 Essential (primary) hypertension: Secondary | ICD-10-CM | POA: Insufficient documentation

## 2024-02-02 DIAGNOSIS — R519 Headache, unspecified: Secondary | ICD-10-CM | POA: Diagnosis not present

## 2024-02-02 DIAGNOSIS — W19XXXA Unspecified fall, initial encounter: Secondary | ICD-10-CM | POA: Diagnosis not present

## 2024-02-02 DIAGNOSIS — Y732 Prosthetic and other implants, materials and accessory gastroenterology and urology devices associated with adverse incidents: Secondary | ICD-10-CM | POA: Insufficient documentation

## 2024-02-02 DIAGNOSIS — R110 Nausea: Secondary | ICD-10-CM | POA: Diagnosis not present

## 2024-02-02 DIAGNOSIS — T83511A Infection and inflammatory reaction due to indwelling urethral catheter, initial encounter: Secondary | ICD-10-CM | POA: Diagnosis not present

## 2024-02-02 DIAGNOSIS — R410 Disorientation, unspecified: Secondary | ICD-10-CM | POA: Diagnosis not present

## 2024-02-02 DIAGNOSIS — S0990XA Unspecified injury of head, initial encounter: Secondary | ICD-10-CM | POA: Diagnosis not present

## 2024-02-02 DIAGNOSIS — R112 Nausea with vomiting, unspecified: Secondary | ICD-10-CM | POA: Diagnosis not present

## 2024-02-02 DIAGNOSIS — W01198A Fall on same level from slipping, tripping and stumbling with subsequent striking against other object, initial encounter: Secondary | ICD-10-CM | POA: Insufficient documentation

## 2024-02-02 DIAGNOSIS — N390 Urinary tract infection, site not specified: Secondary | ICD-10-CM | POA: Insufficient documentation

## 2024-02-02 DIAGNOSIS — I6523 Occlusion and stenosis of bilateral carotid arteries: Secondary | ICD-10-CM | POA: Diagnosis not present

## 2024-02-02 LAB — CBC WITH DIFFERENTIAL/PLATELET
Abs Immature Granulocytes: 0.02 K/uL (ref 0.00–0.07)
Basophils Absolute: 0.1 K/uL (ref 0.0–0.1)
Basophils Relative: 1 %
Eosinophils Absolute: 0.1 K/uL (ref 0.0–0.5)
Eosinophils Relative: 1 %
HCT: 44 % (ref 39.0–52.0)
Hemoglobin: 15 g/dL (ref 13.0–17.0)
Immature Granulocytes: 0 %
Lymphocytes Relative: 21 %
Lymphs Abs: 1.7 K/uL (ref 0.7–4.0)
MCH: 32.3 pg (ref 26.0–34.0)
MCHC: 34.1 g/dL (ref 30.0–36.0)
MCV: 94.8 fL (ref 80.0–100.0)
Monocytes Absolute: 0.7 K/uL (ref 0.1–1.0)
Monocytes Relative: 8 %
Neutro Abs: 5.5 K/uL (ref 1.7–7.7)
Neutrophils Relative %: 69 %
Platelets: 261 K/uL (ref 150–400)
RBC: 4.64 MIL/uL (ref 4.22–5.81)
RDW: 12 % (ref 11.5–15.5)
WBC: 8.1 K/uL (ref 4.0–10.5)
nRBC: 0 % (ref 0.0–0.2)

## 2024-02-02 LAB — COMPREHENSIVE METABOLIC PANEL WITH GFR
ALT: 6 U/L (ref 0–44)
AST: 18 U/L (ref 15–41)
Albumin: 4.2 g/dL (ref 3.5–5.0)
Alkaline Phosphatase: 109 U/L (ref 38–126)
Anion gap: 12 (ref 5–15)
BUN: 22 mg/dL (ref 8–23)
CO2: 24 mmol/L (ref 22–32)
Calcium: 9.4 mg/dL (ref 8.9–10.3)
Chloride: 103 mmol/L (ref 98–111)
Creatinine, Ser: 1.39 mg/dL — ABNORMAL HIGH (ref 0.61–1.24)
GFR, Estimated: 51 mL/min — ABNORMAL LOW (ref >60)
Glucose, Bld: 101 mg/dL — ABNORMAL HIGH (ref 70–99)
Potassium: 4.2 mmol/L (ref 3.5–5.1)
Sodium: 139 mmol/L (ref 135–145)
Total Bilirubin: 0.4 mg/dL (ref 0.0–1.2)
Total Protein: 7.1 g/dL (ref 6.5–8.1)

## 2024-02-02 LAB — URINALYSIS, ROUTINE W REFLEX MICROSCOPIC
Bacteria, UA: NONE SEEN
Bilirubin Urine: NEGATIVE
Glucose, UA: NEGATIVE mg/dL
Ketones, ur: NEGATIVE mg/dL
Nitrite: NEGATIVE
Protein, ur: >=300 mg/dL — AB
RBC / HPF: >50 RBC/hpf (ref 0–5)
Specific Gravity, Urine: 1.018 (ref 1.005–1.030)
WBC, UA: >50 WBC/hpf (ref 0–5)
pH: 6 (ref 5.0–8.0)

## 2024-02-02 LAB — TROPONIN T, HIGH SENSITIVITY
Troponin T High Sensitivity: <15 ng/L (ref 0–19)
Troponin T High Sensitivity: <15 ng/L (ref 0–19)

## 2024-02-02 MED ORDER — LEVOFLOXACIN 750 MG PO TABS
750.0000 mg | ORAL_TABLET | Freq: Once | ORAL | Status: AC
  Administered 2024-02-02: 750 mg via ORAL
  Filled 2024-02-02: qty 1

## 2024-02-02 MED ORDER — SODIUM CHLORIDE 0.9 % IV BOLUS
1000.0000 mL | Freq: Once | INTRAVENOUS | Status: AC
  Administered 2024-02-02: 1000 mL via INTRAVENOUS

## 2024-02-02 MED ORDER — LEVOFLOXACIN 750 MG PO TABS: 750.0000 mg | ORAL_TABLET | Freq: Every day | ORAL | 0 refills | Status: AC

## 2024-02-02 MED ORDER — ONDANSETRON HCL 4 MG PO TABS: 4.0000 mg | ORAL_TABLET | Freq: Four times a day (QID) | ORAL | 0 refills | Status: AC

## 2024-02-02 NOTE — Discharge Instructions (Signed)
 Thankfully all of your testing was reassuring, your urinary testing did show that you likely have some residual infection and despite being on the antibiotic I would like for you to change it to Levaquin once a day for the next 6 days.  You do not need to continue to take the Bactrim.  You should see your urologist at your appointment on Monday, come back to the ER for worsening symptoms

## 2024-02-02 NOTE — ED Triage Notes (Signed)
 Pt comes by EMS from [place]. Pt was here earlier for a fall and d/c back to facility. Pt's hospice nurse sent him back for a CT scan and to see why pt keeps throwing up.  Alert but confused.

## 2024-02-02 NOTE — ED Notes (Signed)
Report called to [facility]. 

## 2024-02-02 NOTE — ED Provider Notes (Signed)
 [place] EMERGENCY DEPARTMENT AT [facility] Provider Note   CSN: [id number] Arrival date & time: 02/02/24  1454     Patient presents with: Hypotension   Warren Rojas is a 83 y.o. male.   HPI   This patient is an 84 year old male coming from [facility] assisted care facility, the patient reportedly has a history of some cognitive decline, he had a slip and fall today, bumped his head, there was no loss of consciousness, in fact the initial call came out for an accidental fall without any head injury, it is difficult to tell whether there was one in the patient does not have any memory of the event.  He does not know why he is here and he actually refused transport on scene however the paramedics did some orthostatic vital signs and found the patient to have a significant drop in his blood pressure from 150 down to 78, the patient was not symptomatic with this but because of this they were able to convince him to come to the hospital.  The patient does have an indwelling Foley catheter to a leg bag on his left leg.  He has had prior cardiac surgery, the patient cannot give me the details of that.  Prior to Admission medications   Medication Sig Start Date End Date Taking? Authorizing Provider  levofloxacin (LEVAQUIN) 750 MG tablet Take 1 tablet (750 mg total) by mouth daily. 02/02/24  Yes Cleotilde Rogue, MD  amoxicillin-clavulanate (AUGMENTIN) 500-125 MG tablet Take 1 tablet by mouth 2 (two) times daily. 01/04/24   [provider]  citalopram  (CELEXA ) 20 MG tablet Take 20 mg by mouth daily. 06/14/23   [provider]  dutasteride  (AVODART ) 0.5 MG capsule Take 1 capsule (0.5 mg total) by mouth daily. 11/02/23   Stoneking, Adine PARAS., MD  haloperidol (HALDOL) 1 MG tablet Take 0.5 mg by mouth every 6 (six) hours as needed. 07/31/23   [provider]  ondansetron  (ZOFRAN -ODT) 4 MG disintegrating tablet Take 1 tablet (4 mg total) by mouth every 8  (eight) hours as needed. 11/03/23   Midge Golas, MD  ondansetron  (ZOFRAN -ODT) 8 MG disintegrating tablet Take 8 mg by mouth every 6 (six) hours. 09/05/23   [provider]  silodosin  (RAPAFLO ) 8 MG CAPS capsule Take 8 mg by mouth daily. 01/17/24   [provider]  simvastatin  (ZOCOR ) 20 MG tablet Take 20 mg by mouth at bedtime.     [provider]    Allergies: Cephalosporins, Innovar [fentanyl -droperidol], Cephalexin, Codeine, and Lorazepam     Review of Systems  All other systems reviewed and are negative.   Updated Vital Signs BP (!) 160/72   Pulse 73   Temp 98.8 F (37.1 C) (Oral)   Resp 17   Ht 1.829 m (6')   Wt 89.4 kg   SpO2 94%   BMI 26.73 kg/m   Physical Exam Vitals and nursing note reviewed.  Constitutional:      General: He is not in acute distress.    Appearance: He is well-developed.  HENT:     Head: Normocephalic and atraumatic.     Mouth/Throat:     Pharynx: No oropharyngeal exudate.  Eyes:     General: No scleral icterus.       Right eye: No discharge.        Left eye: No discharge.     Conjunctiva/sclera: Conjunctivae normal.     Pupils: Pupils are equal, round, and reactive to light.  Neck:     Thyroid : No thyromegaly.     Vascular: No JVD.  Cardiovascular:     Rate and Rhythm: Normal rate and regular rhythm.     Heart sounds: Normal heart sounds. No murmur heard.    No friction rub. No gallop.  Pulmonary:     Effort: Pulmonary effort is normal. No respiratory distress.     Breath sounds: Normal breath sounds. No wheezing or rales.  Abdominal:     General: Bowel sounds are normal. There is no distension.     Palpations: Abdomen is soft. There is no mass.     Tenderness: There is no abdominal tenderness.  Genitourinary:    Comments: Foley catheter well-seated, cloudy urine Musculoskeletal:        General: No tenderness. Normal range of motion.     Cervical back: Normal range of motion and neck supple.     Right  lower leg: No edema.     Left lower leg: No edema.  Lymphadenopathy:     Cervical: No cervical adenopathy.  Skin:    General: Skin is warm and dry.     Findings: No erythema or rash.  Neurological:     Mental Status: He is alert.     Coordination: Coordination normal.  Psychiatric:        Behavior: Behavior normal.     (all labs ordered are listed, but only abnormal results are displayed) Labs Reviewed  COMPREHENSIVE METABOLIC PANEL WITH GFR - Abnormal; Notable for the following components:      Result Value   Glucose, Bld 101 (*)    Creatinine, Ser 1.39 (*)    GFR, Estimated 51 (*)    All other components within normal limits  URINALYSIS, ROUTINE W REFLEX MICROSCOPIC - Abnormal; Notable for the following components:   APPearance CLOUDY (*)    Hgb urine dipstick MODERATE (*)    Protein, ur >=300 (*)    Leukocytes,Ua MODERATE (*)    All other components within normal limits  URINE CULTURE  CBC WITH DIFFERENTIAL/PLATELET  TROPONIN T, HIGH SENSITIVITY  TROPONIN T, HIGH SENSITIVITY    EKG: EKG Interpretation Date/Time:  Saturday February 02 2024 15:21:12 EDT Ventricular Rate:  81 PR Interval:  127 QRS Duration:  102 QT Interval:  410 QTC Calculation: 476 R Axis:   95  Text Interpretation: Sinus rhythm Right axis deviation Borderline prolonged QT interval Confirmed by Cleotilde Rogue ([id number]) on 02/02/2024 4:40:25 PM  Radiology: No results found.   Procedures   Medications Ordered in the ED  levofloxacin (LEVAQUIN) tablet 750 mg (has no administration in time range)  sodium chloride  0.9 % bolus 1,000 mL (0 mLs Intravenous Stopped 02/02/24 1614)                                    Medical Decision Making Amount and/or Complexity of Data Reviewed Labs: ordered. ECG/medicine tests: ordered.  Risk Prescription drug management.    This patient presents to the ED for concern of accidental fall, this involves an extensive number of treatment options, and is a  complaint that carries with it a high risk of complications and morbidity.  The differential diagnosis includes infection, dehydration, cardiac or electrolyte abnormalities, check renal function   Co morbidities / Chronic conditions that complicate the patient evaluation  Prior coronary bypass, indwelling Foley   Additional history obtained:  Additional history obtained from EMR External records from  outside source obtained and reviewed including medic report and prehospital cardiac tracings   Lab Tests:  I Ordered, and personally interpreted labs.  The pertinent results include: Urinary tract infection, no leukocytosis, normal troponin, normal metabolic panel except for slight creatinine elevation at 1.39, urinalysis with greater than 50 white blood cells and red blood cells    Cardiac Monitoring: / EKG:  The patient was maintained on a cardiac monitor.  I personally viewed and interpreted the cardiac monitored which showed an underlying rhythm of: Normal sinus rhythm   Problem List / ED Course / Critical interventions / Medication management  The patient has evidence of urinary tract infection on micro, culture pending, he has been on Bactrim this week, he has not had no further hypotension here I ordered medication including levofloxacin Reevaluation of the patient after these medicines showed that the patient stable I have reviewed the patients home medicines and have made adjustments as needed  Spouse at the bedside, I discussed all the results with her, she is agreeable to the plan  Social Determinants of Health:  Dementia   Test / Admission - Considered:  Considered admission but the patient is very stable appearing for discharge      Final diagnoses:  Urinary tract infection associated with indwelling urethral catheter, initial encounter    ED Discharge Orders          Ordered    levofloxacin (LEVAQUIN) 750 MG tablet  Daily        02/02/24 1750                Cleotilde Rogue, MD 02/02/24 1751

## 2024-02-02 NOTE — Discharge Instructions (Signed)
 Your CT scan was totally normal Follow-up with your doctor this week

## 2024-02-02 NOTE — ED Provider Notes (Signed)
 [facility] AT [facility] Provider Note   CSN: [id number] Arrival date & time: 02/02/24  2126     Patient presents with: Fall (CT scan)   Warren Rojas is a 83 y.o. male.    Fall   Patient is an 83 year old male who was seen earlier in the evening after having an accidental slip and fall, he was trying to navigate from a chair and fell bumping his head.  He was initially seen and had a very thorough exam, his lab workup was unremarkable except for UTI for which she was treated with levofloxacin, the patient had 1 episode of vomiting when he got back to the facility in the nurse on-call recommended that he come back to the ER for evaluation of a possible concussion versus some type of brain injury.  Again on my initial evaluation it was clear that the patient did not have any significant signs of brain injury or head injury.    Prior to Admission medications   Medication Sig Start Date End Date Taking? Authorizing Provider  ondansetron  (ZOFRAN ) 4 MG tablet Take 1 tablet (4 mg total) by mouth every 6 (six) hours. 02/02/24  Yes Cleotilde Rogue, MD  citalopram  (CELEXA ) 20 MG tablet Take 20 mg by mouth daily. 06/14/23   [provider]  dutasteride  (AVODART ) 0.5 MG capsule Take 1 capsule (0.5 mg total) by mouth daily. 11/02/23   Stoneking, Adine PARAS., MD  haloperidol (HALDOL) 1 MG tablet Take 0.5 mg by mouth every 6 (six) hours as needed. 07/31/23   [provider]  levofloxacin (LEVAQUIN) 750 MG tablet Take 1 tablet (750 mg total) by mouth daily. 02/02/24   Cleotilde Rogue, MD  ondansetron  (ZOFRAN -ODT) 4 MG disintegrating tablet Take 1 tablet (4 mg total) by mouth every 8 (eight) hours as needed. 11/03/23   Midge Golas, MD  ondansetron  (ZOFRAN -ODT) 8 MG disintegrating tablet Take 8 mg by mouth every 6 (six) hours. 09/05/23   [provider]  silodosin  (RAPAFLO ) 8 MG CAPS capsule Take 8 mg by mouth daily. 01/17/24   [provider]  simvastatin  (ZOCOR ) 20 MG tablet Take 20 mg by mouth at bedtime.     [provider]    Allergies: Cephalosporins, Innovar [fentanyl -droperidol], Cephalexin, Codeine, and Lorazepam     Review of Systems  All other systems reviewed and are negative.   Updated Vital Signs BP (!) 168/87   Pulse 82   Temp 98.5 F (36.9 C) (Oral)   Resp 18   SpO2 92%   Physical Exam Vitals and nursing note reviewed.  Constitutional:      General: He is not in acute distress.    Appearance: He is well-developed.  HENT:     Head: Normocephalic and atraumatic.     Mouth/Throat:     Pharynx: No oropharyngeal exudate.  Eyes:     General: No scleral icterus.       Right eye: No discharge.        Left eye: No discharge.     Conjunctiva/sclera: Conjunctivae normal.     Pupils: Pupils are equal, round, and reactive to light.  Neck:     Thyroid : No thyromegaly.     Vascular: No JVD.  Cardiovascular:     Rate and Rhythm: Normal rate and regular rhythm.     Heart sounds: Normal heart sounds. No murmur heard.    No friction rub. No gallop.  Pulmonary:     Effort: Pulmonary effort is normal.  No respiratory distress.     Breath sounds: Normal breath sounds. No wheezing or rales.  Abdominal:     General: Bowel sounds are normal. There is no distension.     Palpations: Abdomen is soft. There is no mass.     Tenderness: There is no abdominal tenderness.  Musculoskeletal:        General: No tenderness. Normal range of motion.     Cervical back: Normal range of motion and neck supple.  Lymphadenopathy:     Cervical: No cervical adenopathy.  Skin:    General: Skin is warm and dry.     Findings: No erythema or rash.  Neurological:     Mental Status: He is alert.     Coordination: Coordination normal.     Comments: Able to follow commands with all 4 extremities, pleasantly demented  Psychiatric:        Behavior: Behavior normal.     (all labs ordered are listed, but only abnormal  results are displayed) Labs Reviewed - No data to display  EKG: None  Radiology: CT Head Wo Contrast Result Date: 02/02/2024 EXAM: CT HEAD WITHOUT CONTRAST 02/02/2024 10:22:00 PM TECHNIQUE: CT of the head was performed without the administration of intravenous contrast. Automated exposure control, iterative reconstruction, and/or weight based adjustment of the mA/kV was utilized to reduce the radiation dose to as low as reasonably achievable. COMPARISON: None available. CLINICAL HISTORY: Head trauma, abnormal mental status (Age 67-64y). fall and d/c back to facility. Pt's hospice nurse sent him back for a CT scan and to see why pt keeps throwing up.; ; Alert but confused FINDINGS: BRAIN AND VENTRICLES: No acute hemorrhage. No evidence of acute infarct. No extra-axial collection. No mass effect or midline shift. Patchy and confluent decreased attenuation throughout deep and periventricular white matter of cerebral hemispheres bilaterally, compatible with chronic microvascular ischemic disease. Cerebral ventricle sizes concordant with cerebral volume loss. Atherosclerotic calcifications within cavernous internal carotid arteries. ORBITS: No acute abnormality. SINUSES: Bilateral maxillary sinus mucosal thickening. SOFT TISSUES AND SKULL: No acute soft tissue abnormality. No skull fracture. IMPRESSION: 1. No acute intracranial abnormality. Electronically signed by: Norman Gatlin MD 02/02/2024 10:31 PM EDT RP Workstation: [id number]     Procedures   Medications Ordered in the ED - No data to display                                  Medical Decision Making Amount and/or Complexity of Data Reviewed Radiology: ordered.  Risk Prescription drug management.   I discussed the case again with the patient's spouse, she was unclear exactly why he was sent over except that the nurse recommended it, she is happy with the result of not having any signs of brain bleeding, the patient does have a DO NOT  RESUSCITATE, I have reviewed all of the labs with him, he is mildly hypertensive but otherwise in no distress.  The patient will be discharged home, prescription for Zofran .   Radiology Imaging: I personally viewed the images of the ordered radiographic studies and find no findings of intracranial hemorrhage or fracture I agree with the radiologist interpretation as well      Final diagnoses:  Fall, initial encounter    ED Discharge Orders          Ordered    ondansetron  (ZOFRAN ) 4 MG tablet  Every 6 hours        02/02/24 2245  Cleotilde Rogue, MD 02/02/24 [phone]

## 2024-02-02 NOTE — ED Triage Notes (Addendum)
 [facility] from [place]. Pt slid from chair to floor; hit head lightly. No LOC. Orthostats 155/76 standing and 78/49 sitting. A&O to himself with EMS. Leg bag on right leg, urine appears cloudy.

## 2024-02-04 ENCOUNTER — Encounter: Payer: Self-pay | Admitting: Urology

## 2024-02-04 ENCOUNTER — Ambulatory Visit: Admitting: Urology

## 2024-02-04 VITALS — BP 148/88 | HR 101

## 2024-02-04 DIAGNOSIS — N401 Enlarged prostate with lower urinary tract symptoms: Secondary | ICD-10-CM | POA: Diagnosis not present

## 2024-02-04 DIAGNOSIS — N390 Urinary tract infection, site not specified: Secondary | ICD-10-CM | POA: Diagnosis not present

## 2024-02-04 DIAGNOSIS — Z960 Presence of urogenital implants: Secondary | ICD-10-CM

## 2024-02-04 DIAGNOSIS — T83511S Infection and inflammatory reaction due to indwelling urethral catheter, sequela: Secondary | ICD-10-CM | POA: Diagnosis not present

## 2024-02-04 DIAGNOSIS — R338 Other retention of urine: Secondary | ICD-10-CM | POA: Diagnosis not present

## 2024-02-04 DIAGNOSIS — Z978 Presence of other specified devices: Secondary | ICD-10-CM

## 2024-02-04 DIAGNOSIS — N138 Other obstructive and reflux uropathy: Secondary | ICD-10-CM

## 2024-02-04 DIAGNOSIS — R339 Retention of urine, unspecified: Secondary | ICD-10-CM

## 2024-02-04 NOTE — Progress Notes (Signed)
 Assessment: 1. Urinary retention   2. BPH with obstruction/lower urinary tract symptoms   3. Chronic indwelling Foley catheter   4. Urinary tract infection associated with catheterization of urinary tract, unspecified indwelling urinary catheter type, sequela     Plan: I reviewed the results from his recent ER visit. After reviewing the recent urine culture results, recommend discontinuing Bactrim DS and beginning Cipro  500 mg twice daily x 7 days. Discontinue silodosin  Continue dutasteride . Continue Foley catheter with changes monthly by home health. Return to office in 3 months.   Chief Complaint: Chief Complaint  Patient presents with   Urinary Retention    HPI: Warren Rojas is a 83 y.o. male who presents for continued evaluation of a urinary retention with chronic indwelling Foley. He presented to the emergency room on 07/17/2023 with decreased p.o. intake and abdominal pain.  He was found to have AKI with a creatinine of 3.31.  A Foley catheter was placed with return of 3 L of urine.  CT imaging showed bilateral hydronephrosis, 6 mm distal right ureteral calculus, and multiple bladder calculi.  His renal function improved with bladder drainage.  He was taken to the operating room on 07/18/2023 for cystoscopy, bilateral retrograde pyelograms, cystolitholopaxy, right ureteroscopic laser lithotripsy, left ureteroscopy, and insertion of a right ureteral stent without tether.  No urethral strictures were identified on cystoscopy.  He was noted to have bilobar enlargement of the prostate with elevation of the bladder neck and a large capacity bladder with trabeculations and cellules.  Multiple bladder calculi were removed.  A 6 mm calculus in the distal right ureter was treated with laser lithotripsy.  A right ureteral stent was placed. His renal function improved and his creatinine was 2.21 on 07/20/2023.  He was discharged home with the Foley catheter in place.  He has continued on tamsulosin   and Avodart . Creatinine from 07/27/2023 improved to 1.27. His right ureteral stent was removed on 08/01/2023.  His Foley catheter was removed at that time as well.  He was unable to void in the office. His Foley catheter was replaced by hospice on 08/04/2023 due to inability to void.  His family reports that approximately 3 L was drained from his bladder. At the time of his visit on 08/08/2023, his catheter was draining well.  He continued on tamsulosin  and dutasteride .  I had a lengthy discussion with the patient and his family at that time regarding options for management of his urinary retention.  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  I discussed options for management given his large prostate volume including HoLEP and simple prostatectomy. He was changed to silodosin  at his visit on 08/08/2023.  Prostate volume measures approximately 93 cm from recent CT.  His Foley was removed on 08/27/2023.  He was unable to void while in the office.  He returned to the emergency room on 08/30/2023 with retention and a Foley catheter was placed.  He had almost 5 L of urine in his bladder at that time. At the time of his visit in May 2025, he continued with a Foley catheter.  I had a lengthy discussion with the family regarding further evaluation of his urinary retention and management options.  We discussed further evaluation with urodynamics.  At that time, they elected to continue with Foley catheter drainage.  He was last seen in July 2025.  He had to have the catheter replaced by home health care after it was accidentally removed.  The catheter was draining  well.  No gross hematuria.  No fevers or chills. He was recently diagnosed with a UTI.  Urine culture from 02/02/2024 grew >100 K Pseudomonas and 30K stenotrophomonas.  He was given Levaquin x 1 which caused N/V.  He presents today for follow-up.  He reports that the catheter has been draining well.  He has not had any gross hematuria.  He is  having his catheter changed monthly.  His catheter was last changed approximately 3 weeks ago.  No problems with catheter changes.  Portions of the above documentation were copied from a prior visit for review purposes only.  Allergies: Allergies  Allergen Reactions   Cephalosporins Other (See Comments)    Had Hepatitis from water and was advised to stay away of these medications   Innovar [Fentanyl -Droperidol] Anaphylaxis    Aspiration   Cephalexin Other (See Comments)    Unknown    Codeine Nausea And Vomiting   Lorazepam  Other (See Comments)    Unknown     PMH: Past Medical History:  Diagnosis Date   Bladder outlet obstruction    BPH (benign prostatic hypertrophy)    Colon polyp 2010   Coronary atherosclerosis of native coronary artery    Multivessel, normal LVEF   Depression 1997   Diverticulosis    Essential hypertension, benign    Hepatitis A 1/92   Kidney stone    Mixed hyperlipidemia    Personal history of rectal adenoma 03/23/2009   Skin cancer     PSH: Past Surgical History:  Procedure Laterality Date   APPENDECTOMY     COLONOSCOPY  03/2009   tortuous colon   CORONARY ARTERY BYPASS GRAFT  2004   Dr. Army - LIMA to LAD, RIMA to ramus, left radial to OM, SVG to diagonal, SVG to PDA   CYSTOSCOPY     CYSTOSCOPY/URETEROSCOPY/HOLMIUM LASER/STENT PLACEMENT Right 07/18/2023   Procedure: CYSTOSCOPY/bilateral URETEROSCOPY/  right HOLMIUM LASER/  right STENT PLACEMENT/ bilateral Retrograde pylogram;  Surgeon: Roseann Adine PARAS., MD;  Location: [facility];  Service: Urology;  Laterality: Right;   Deviated nasal septum repair  1963   EUS N/A 12/25/2013   Procedure: UPPER ENDOSCOPIC ULTRASOUND (EUS) LINEAR;  Surgeon: Toribio SHAUNNA Cedar, MD;  Location: [facility];  Service: Endoscopy;  Laterality: N/A;   Resection of urethral diverticulum  1975   TONSILLECTOMY      SH: Social History   Tobacco Use   Smoking status: Former    Current packs/day: 0.00    Types:  Cigarettes    Quit date: 04/18/1963    Years since quitting: 60.8    Passive exposure: Past   Smokeless tobacco: Never   Tobacco comments:    smoked 3 packs total while in [employer] Use   Vaping status: Never Used  Substance Use Topics   Alcohol  use: No   Drug use: No    ROS: Constitutional:  Negative for fever, chills, weight loss CV: Negative for chest pain, previous MI, hypertension Respiratory:  Negative for shortness of breath, wheezing, sleep apnea, frequent cough GI:  Negative for nausea, vomiting, bloody stool, GERD   PE: BP (!) 148/88   Pulse (!) 101  GENERAL APPEARANCE:  Well appearing, well developed, well nourished, NAD HEENT:  Atraumatic, normocephalic, oropharynx clear NECK:  Supple without lymphadenopathy or thyromegaly ABDOMEN:  Soft, non-tender, no masses EXTREMITIES:  Moves all extremities well, without clubbing, cyanosis, or edema NEUROLOGIC:  Alert and oriented x 3, CN II-XII grossly intact MENTAL STATUS:  appropriate BACK:  Non-tender  to palpation, No CVAT SKIN:  Warm, dry, and intact GU: Foley draining dark yellow urine  Results: None

## 2024-02-08 LAB — SUSCEPTIBILITY, AER + ANAEROB

## 2024-02-08 LAB — SUSCEPTIBILITY RESULT

## 2024-02-10 LAB — URINE CULTURE: Culture: >=100000 — AB

## 2024-02-11 ENCOUNTER — Telehealth (HOSPITAL_BASED_OUTPATIENT_CLINIC_OR_DEPARTMENT_OTHER): Payer: Self-pay | Admitting: *Deleted

## 2024-02-11 NOTE — Telephone Encounter (Signed)
 Post ED Visit - Positive Culture Follow-up  Culture report reviewed by antimicrobial stewardship pharmacist: [facility] [x]  Dorn Poot, Pharm.D. []  Venetia Gully, Pharm.D., BCPS AQ-ID []  Garrel Crews, Pharm.D., BCPS []  Almarie Lunger, Pharm.D., BCPS []  O'Donnell, [place].D., BCPS, AAHIVP []  Rosaline Bihari, Pharm.D., BCPS, AAHIVP []  Vernell Meier, PharmD, BCPS []  Latanya Hint, PharmD, BCPS []  Donald Medley, PharmD, BCPS []  Rocky Bold, PharmD []  Dorothyann Alert, PharmD, BCPS []  Morene Babe, PharmD  [facility] []  Rosaline Edison, PharmD []  Romona Bliss, PharmD []  Dolphus Roller, PharmD []  Veva Seip, Rph []  Vernell Daunt) Leonce, PharmD []  Eva Allis, PharmD []  Rosaline Millet, PharmD []  Iantha Batch, PharmD []  Arvin Gauss, PharmD []  Wanda Hasting, PharmD []  Ronal Rav, PharmD []  Rocky Slade, PharmD []  Bard Jeans, PharmD   Positive urine culture Treated with augmentin and levaquin, organism sensitive to the same and no further patient follow-up is required at this time.  Lorita Barnie Pereyra 02/11/2024, 10:08 AM

## 2024-05-07 ENCOUNTER — Ambulatory Visit: Admitting: Urology
